# Patient Record
Sex: Female | Born: 1970 | Race: White | Hispanic: No | Marital: Married | Smoking: Current every day smoker
Health system: Southern US, Community
[De-identification: ages and names within clinical notes are randomized; demographics above are authoritative.]

## PROBLEM LIST (undated history)

## (undated) DIAGNOSIS — F419 Anxiety disorder, unspecified: Secondary | ICD-10-CM

---

## 2001-01-01 ENCOUNTER — Inpatient Hospital Stay (HOSPITAL_COMMUNITY): Admission: AD | Admit: 2001-01-01 | Discharge: 2001-01-04 | Payer: Self-pay | Admitting: Obstetrics and Gynecology

## 2001-01-05 ENCOUNTER — Encounter: Admission: RE | Admit: 2001-01-05 | Discharge: 2001-02-04 | Payer: Self-pay | Admitting: Obstetrics and Gynecology

## 2001-02-05 ENCOUNTER — Encounter: Admission: RE | Admit: 2001-02-05 | Discharge: 2001-03-07 | Payer: Self-pay | Admitting: Obstetrics and Gynecology

## 2002-01-23 ENCOUNTER — Other Ambulatory Visit: Admission: RE | Admit: 2002-01-23 | Discharge: 2002-01-23 | Payer: Self-pay | Admitting: Obstetrics and Gynecology

## 2002-09-25 ENCOUNTER — Inpatient Hospital Stay (HOSPITAL_COMMUNITY): Admission: AD | Admit: 2002-09-25 | Discharge: 2002-09-27 | Payer: Self-pay | Admitting: Obstetrics and Gynecology

## 2002-09-28 ENCOUNTER — Encounter: Admission: RE | Admit: 2002-09-28 | Discharge: 2002-10-28 | Payer: Self-pay | Admitting: Obstetrics & Gynecology

## 2003-02-11 ENCOUNTER — Other Ambulatory Visit: Admission: RE | Admit: 2003-02-11 | Discharge: 2003-02-11 | Payer: Self-pay | Admitting: Obstetrics & Gynecology

## 2004-08-04 ENCOUNTER — Other Ambulatory Visit: Admission: RE | Admit: 2004-08-04 | Discharge: 2004-08-04 | Payer: Self-pay | Admitting: Obstetrics & Gynecology

## 2009-05-05 ENCOUNTER — Ambulatory Visit (HOSPITAL_COMMUNITY): Admission: RE | Admit: 2009-05-05 | Discharge: 2009-05-05 | Payer: Self-pay | Admitting: Family Medicine

## 2010-10-20 NOTE — Discharge Summary (Signed)
NAMEDANIRA, Sharon Gillespie                         ACCOUNT NO.:  [id number]   MEDICAL RECORD NO.:  [id number]                   PATIENT TYPE:  INP   LOCATION:  9103                                 FACILITY:  [facility]   PHYSICIAN:  Gerrit Friends. Aldona Bar, M.D.                DATE OF BIRTH:  12-09-1970   DATE OF ADMISSION:  09/25/2002  DATE OF DISCHARGE:  09/27/2002                                 DISCHARGE SUMMARY   DISCHARGE DIAGNOSES:  1. Term pregnancy, delivered 7-pound-7-ounce female infant, Apgars 4, 7, and     8.  2. Blood type A positive.   PROCEDURE:  Normal spontaneous delivery.   SUMMARY:  This 40 year old gravida 2, para 1 was seen in the office on the  morning of 09/25/2002 and noted to be in early labor; she was term.  She had  good progression of her labor pattern.  She requested an epidural when she  was about 6 cm dilated, but unfortunately, there was going to be a delay in  placement of the epidural.  Stadol 0.5 mg was given intravenously.  Shortly  thereafter, the epidural was placed, and shortly after that, the patient  began pushing and, within one hour of receiving the Stadol, delivered a  normal female infant spontaneously.  Baby weighed 7 pounds 7 ounces.  She  delivered over an intact perineum.  There was a nuchal cord times one; it  required cutting to reduce prior to delivery of the body.  The Apgars were  noted to be 4, 7, and 8.  The baby was very floppy but responded well to  Narcan.  The pediatric intensive care team was called and responded, and, as  mentioned, the baby responded well to one dose of Narcan.  The presentation  at delivery was probably, in retrospect, related to the recent Stadol.   The mother's postpartum course was totally benign.  On the morning of  09/26/2002, her hemoglobin was 10.4 with a white count of 11,600 and a  platelet count of 129,000.  She was both breast feeding and bottle feeding.  On the morning of 09/27/2002, she was doing well; she  was ambulating,  tolerating a regular diet well, having normal bowel and bladder function,  was comfortable.  Vital signs were stable, breast feeding was going fairly  well, and she was ready for discharge, reporting that she was given all of  her instructions, asked questions and answered all questions as well.   DISCHARGE MEDICATIONS:  1. Vitamins one per day.  2. Ferrous sulfate 300 mg daily.  3. Motrin 600 mg every six hours as needed for pain.   FOLLOW UP:  She will return to the office for followup in approximately four  weeks' time or as needed.   CONDITION ON DISCHARGE:  Improved.  Gerrit Friends. Aldona Bar, M.D.    RMW/MEDQ  D:  09/27/2002  T:  09/27/2002  Job:  [id number]

## 2011-09-26 ENCOUNTER — Other Ambulatory Visit: Payer: Self-pay | Admitting: Obstetrics & Gynecology

## 2011-09-26 DIAGNOSIS — R928 Other abnormal and inconclusive findings on diagnostic imaging of breast: Secondary | ICD-10-CM

## 2011-09-28 ENCOUNTER — Ambulatory Visit
Admission: RE | Admit: 2011-09-28 | Discharge: 2011-09-28 | Disposition: A | Discharge status: Home / Self Care | Payer: Commercial Indemnity | Source: Ambulatory Visit | Attending: Obstetrics & Gynecology | Admitting: Obstetrics & Gynecology

## 2011-09-28 DIAGNOSIS — R928 Other abnormal and inconclusive findings on diagnostic imaging of breast: Secondary | ICD-10-CM

## 2016-04-16 ENCOUNTER — Emergency Department
Admission: EM | Admit: 2016-04-16 | Discharge: 2016-04-16 | Discharge status: Absent without leave | Payer: Commercial Indemnity

## 2020-03-23 ENCOUNTER — Ambulatory Visit: Payer: Commercial Indemnity

## 2020-05-16 ENCOUNTER — Emergency Department (HOSPITAL_COMMUNITY)
Admission: EM | Admit: 2020-05-16 | Discharge: 2020-05-16 | Disposition: A | Discharge status: Home / Self Care | Payer: BC Managed Care – PPO | Attending: Emergency Medicine | Admitting: Emergency Medicine

## 2020-05-16 ENCOUNTER — Encounter (HOSPITAL_COMMUNITY): Payer: Self-pay | Admitting: Emergency Medicine

## 2020-05-16 ENCOUNTER — Ambulatory Visit (INDEPENDENT_AMBULATORY_CARE_PROVIDER_SITE_OTHER): Payer: BC Managed Care – PPO

## 2020-05-16 ENCOUNTER — Other Ambulatory Visit: Payer: Self-pay

## 2020-05-16 ENCOUNTER — Emergency Department (HOSPITAL_COMMUNITY): Payer: BC Managed Care – PPO

## 2020-05-16 ENCOUNTER — Ambulatory Visit
Admission: RE | Admit: 2020-05-16 | Discharge: 2020-05-16 | Disposition: A | Discharge status: Home / Self Care | Payer: BC Managed Care – PPO | Source: Ambulatory Visit

## 2020-05-16 VITALS — BP 111/64 | HR 80 | Temp 99.1°F | Resp 16

## 2020-05-16 DIAGNOSIS — R109 Unspecified abdominal pain: Secondary | ICD-10-CM | POA: Insufficient documentation

## 2020-05-16 DIAGNOSIS — R82998 Other abnormal findings in urine: Secondary | ICD-10-CM | POA: Insufficient documentation

## 2020-05-16 DIAGNOSIS — F17200 Nicotine dependence, unspecified, uncomplicated: Secondary | ICD-10-CM | POA: Insufficient documentation

## 2020-05-16 DIAGNOSIS — R319 Hematuria, unspecified: Secondary | ICD-10-CM | POA: Diagnosis not present

## 2020-05-16 DIAGNOSIS — R1032 Left lower quadrant pain: Secondary | ICD-10-CM | POA: Insufficient documentation

## 2020-05-16 LAB — POCT URINALYSIS DIP (MANUAL ENTRY)
Glucose, UA: NEGATIVE mg/dL
Leukocytes, UA: NEGATIVE
Nitrite, UA: NEGATIVE
Protein Ur, POC: 30 mg/dL — AB
Spec Grav, UA: 1.025 (ref 1.010–1.025)
Urobilinogen, UA: 0.2 E.U./dL
pH, UA: 6 (ref 5.0–8.0)

## 2020-05-16 LAB — URINALYSIS, ROUTINE W REFLEX MICROSCOPIC
Bilirubin Urine: NEGATIVE
Glucose, UA: NEGATIVE mg/dL
Ketones, ur: NEGATIVE mg/dL
Leukocytes,Ua: NEGATIVE
Nitrite: NEGATIVE
Protein, ur: NEGATIVE mg/dL
Specific Gravity, Urine: 1.027 (ref 1.005–1.030)
pH: 5 (ref 5.0–8.0)

## 2020-05-16 LAB — POC URINE PREG, ED: Preg Test, Ur: NEGATIVE

## 2020-05-16 MED ORDER — KETOROLAC TROMETHAMINE 30 MG/ML IJ SOLN: 30.0000 mg | Freq: Once | INTRAMUSCULAR | Status: DC

## 2020-05-16 MED ORDER — CEPHALEXIN 500 MG PO CAPS: 500.0000 mg | ORAL_CAPSULE | Freq: Two times a day (BID) | ORAL | 0 refills | Status: AC

## 2020-05-16 NOTE — Discharge Instructions (Addendum)
Abdomen x-ray was negative Urine analysis is negative for UTI.  Sample will be sent for culture Go to ER for further evaluation

## 2020-05-16 NOTE — ED Triage Notes (Signed)
Pt c/o left sided flank pain, pt was seen at urgent care and sent over here for a ct scan to check for kidney stone.

## 2020-05-16 NOTE — Discharge Instructions (Signed)
You were given a prescription for antibiotics for a possible urinary tract infection. Please take the antibiotic prescription fully.   Please follow up with your primary care provider within 5-7 days for re-evaluation of your symptoms. If you do not have a primary care provider, information for a healthcare clinic has been provided for you to make arrangements for follow up care. Please return to the emergency department for any new or worsening symptoms.

## 2020-05-16 NOTE — ED Triage Notes (Signed)
Pt presents with left rib pain , denies injury, feels a catching sensation

## 2020-05-16 NOTE — ED Provider Notes (Signed)
[facility]   Chief Complaint  Patient presents with  . Flank Pain     SUBJECTIVE:  Sharon Gillespie is a 49 y.o. female presented to the urgent care with a complaint of left flank pain for the past few days.  Patient denies a precipitating event, recent sexual encounter, excessive caffeine intake.  Localizes the pain to the LUQ/ flank.  Pain is intermittent and describes it as aching character.  Has tried OTC medications without relief.  Symptoms are made worse with urination.  Admits to similar symptoms in the past.  Denies fever, chills, nausea, vomiting, abdominal pain, flank pain, abnormal vaginal discharge or bleeding, hematuria.    LMP: No LMP recorded. (Menstrual status: Perimenopausal).  ROS: As in HPI.  All other pertinent ROS negative.     History reviewed. No pertinent past medical history. History reviewed. No pertinent surgical history. No Known Allergies No current facility-administered medications on file prior to encounter.   No current outpatient medications on file prior to encounter.   Social History   Socioeconomic History  . Marital status: Married    Spouse name: Not on file  . Number of children: Not on file  . Years of education: Not on file  . Highest education level: Not on file  Occupational History  . Not on file  Tobacco Use  . Smoking status: Never Smoker  . Smokeless tobacco: Never Used  Substance and Sexual Activity  . Alcohol use: Not Currently  . Drug use: Never  . Sexual activity: Not on file  Other Topics Concern  . Not on file  Social History Narrative   ** Merged History Encounter **       Social Determinants of Health   Financial Resource Strain: Not on file  Food Insecurity: Not on file  Transportation Needs: Not on file  Physical Activity: Not on file  Stress: Not on file  Social Connections: Not on file  Intimate Partner Violence: Not on file   Family History  Family history unknown: Yes     OBJECTIVE:  Vitals:   05/16/20 1226  BP: 111/64  Pulse: 80  Resp: 16  Temp: 99.1 F (37.3 C)  SpO2: 98%   Physical Exam Vitals and nursing note reviewed.  Constitutional:      General: She is not in acute distress.    Appearance: Normal appearance. She is normal weight. She is not ill-appearing, toxic-appearing or diaphoretic.  HENT:     Head: Normocephalic.  Cardiovascular:     Rate and Rhythm: Normal rate and regular rhythm.     Pulses: Normal pulses.     Heart sounds: Normal heart sounds. No murmur heard. No friction rub. No gallop.   Pulmonary:     Effort: Pulmonary effort is normal. No respiratory distress.     Breath sounds: Normal breath sounds. No stridor. No wheezing, rhonchi or rales.  Chest:     Chest wall: No tenderness.  Abdominal:     General: Abdomen is flat. Bowel sounds are normal. There is no distension.     Palpations: Abdomen is soft. There is no mass.     Tenderness: There is no abdominal tenderness. There is left CVA tenderness.     Hernia: No hernia is present.  Musculoskeletal:     Thoracic back: Normal.     Lumbar back: Normal.  Neurological:     Mental Status: She is alert and oriented to person, place, and time.     Labs Reviewed  POCT  URINALYSIS DIP (MANUAL ENTRY) - Abnormal; Notable for the following components:      Result Value   Color, UA straw (*)    Bilirubin, UA small (*)    Ketones, POC UA small (15) (*)    Blood, UA moderate (*)    Protein Ur, POC =30 (*)    All other components within normal limits  URINE CULTURE    ASSESSMENT & PLAN:  1. Left flank pain     No orders of the defined types were placed in this encounter.  Patient is stable at discharge.  She was advised to go to ER for further evaluation to rule out other abdominal disease process.  In the urgent care KUB and urinalysis were negative   Discharge instructions  Abdomen x-ray was negative Urine analysis is negative for UTI.  Sample will be sent  for culture Go to ER for further evaluation  Outlined signs and symptoms indicating need for more acute intervention. Patient verbalized understanding. After Visit Summary given.     Durward Parcel, FNP 05/16/20 [phone]

## 2020-05-16 NOTE — ED Provider Notes (Signed)
[facility] EMERGENCY DEPARTMENT Provider Note   CSN: [id number] Arrival date & time: 05/16/20  1522     History Chief Complaint  Patient presents with  . Flank Pain    Sharon Gillespie is a 49 y.o. female.  HPI   Pt is a 49 y/o female who presents to the ED today for eval of left flank pain. Pain started 2 days ago. States pain has improved since onset. Pain is constant in nature. It is worse with inspiration, coughing, or certain movements. Denies dysuria, frequency or urgency. Denies fevers, diarrhea, vomiting. Last BM today.   Tried gasx without relief  No past medical history on file.  There are no problems to display for this patient.   No past surgical history on file.   OB History   No obstetric history on file.     Family History  Family history unknown: Yes    Social History   Tobacco Use  . Smoking status: Current Every Day Smoker    Packs/day: 0.50  . Smokeless tobacco: Never Used  Substance Use Topics  . Alcohol use: Not Currently  . Drug use: Never    Home Medications Prior to Admission medications   Medication Sig Start Date End Date Taking? Authorizing Provider  norethindrone-ethinyl estradiol 1/35 (ALAYCEN 1/35) tablet Take 1 tablet by mouth daily.   Yes [provider]  sertraline (ZOLOFT) 100 MG tablet Take 100 mg by mouth daily.   Yes [provider]  cephALEXin (KEFLEX) 500 MG capsule Take 1 capsule (500 mg total) by mouth 2 (two) times daily for 7 days. 05/16/20 05/23/20  Janny Crute S, PA-C  ciprofloxacin (CIPRO) 500 MG tablet ciprofloxacin 500 mg tablet  TAKE 1 TABLET TWICE A DAY    [provider]    Allergies    Patient has no known allergies.  Review of Systems   Review of Systems  Constitutional: Negative for fever.  HENT: Negative for ear pain and sore throat.   Eyes: Negative for visual disturbance.  Respiratory: Negative for cough and shortness of breath.   Cardiovascular: Negative for  chest pain.  Gastrointestinal: Positive for abdominal pain. Negative for constipation, diarrhea, nausea and vomiting.  Genitourinary: Positive for flank pain. Negative for dysuria and hematuria.  Musculoskeletal: Negative for back pain.  Skin: Negative for rash.  Neurological: Negative for seizures and syncope.  All other systems reviewed and are negative.   Physical Exam Updated Vital Signs BP (!) 141/89   Pulse 76   Temp 98.5 F (36.9 C) (Oral)   Resp 17   Ht 5\' 6"  (1.676 m)   Wt 81.6 kg   SpO2 100%   BMI 29.05 kg/m   Physical Exam Vitals and nursing note reviewed.  Constitutional:      General: She is not in acute distress.    Appearance: She is well-developed and well-nourished.  HENT:     Head: Normocephalic and atraumatic.  Eyes:     Conjunctiva/sclera: Conjunctivae normal.  Cardiovascular:     Rate and Rhythm: Normal rate and regular rhythm.     Heart sounds: No murmur heard.   Pulmonary:     Effort: Pulmonary effort is normal. No respiratory distress.     Breath sounds: Normal breath sounds.  Abdominal:     General: Bowel sounds are normal.     Palpations: Abdomen is soft.     Tenderness: There is no abdominal tenderness. There is no right CVA tenderness, left CVA tenderness, guarding or rebound.  Musculoskeletal:        General: No edema.     Cervical back: Neck supple.  Skin:    General: Skin is warm and dry.  Neurological:     Mental Status: She is alert.  Psychiatric:        Mood and Affect: Mood and affect normal.     ED Results / Procedures / Treatments   Labs (all labs ordered are listed, but only abnormal results are displayed) Labs Reviewed  URINALYSIS, ROUTINE W REFLEX MICROSCOPIC - Abnormal; Notable for the following components:      Result Value   APPearance HAZY (*)    Hgb urine dipstick MODERATE (*)    Bacteria, UA FEW (*)    All other components within normal limits  POC URINE PREG, ED    EKG None  Radiology DG Abdomen 1  View  Result Date: 05/16/2020 CLINICAL DATA:  Left flank pain with hematuria EXAM: ABDOMEN - 1 VIEW COMPARISON:  None. FINDINGS: The bowel gas pattern is normal. No radio-opaque calculi or other significant radiographic abnormality are seen. IMPRESSION: Negative. Electronically Signed   By: Marlan Palau M.D.   On: 05/16/2020 14:43   CT Renal Stone Study  Result Date: 05/16/2020 CLINICAL DATA:  Left flank pain. EXAM: CT ABDOMEN AND PELVIS WITHOUT CONTRAST TECHNIQUE: Multidetector CT imaging of the abdomen and pelvis was performed following the standard protocol without IV contrast. COMPARISON:  Radiographs earlier today. FINDINGS: Lower chest: Lung bases are clear. No focal airspace disease or pleural fluid. Hepatobiliary: No focal hepatic abnormality on noncontrast exam. Gallbladder physiologically distended, no calcified stone. No biliary dilatation. Pancreas: No ductal dilatation or inflammation. Spleen: Normal in size without focal abnormality. Adrenals/Urinary Tract: Normal adrenal glands. No hydronephrosis, perinephric edema, or renal calculi. Parapelvic cyst adjacent to the mid lower left kidney. No ureteral stones. The urinary bladder is partially distended. No bladder stone. Stomach/Bowel: Stomach decompressed. Normal positioning of the duodenum and ligament of Treitz. Unremarkable small bowel without obstruction or inflammation. Normal appendix. Small to moderate volume of stool throughout the colon. No colonic wall thickening or pericolonic edema. Vascular/Lymphatic: Abdominal aorta is normal in caliber. There is no bulky abdominopelvic adenopathy. Reproductive: Uterus and bilateral adnexa are unremarkable. Other: No ascites, free air, or focal fluid collection. Tiny fat containing umbilical hernia. Musculoskeletal: There are no acute or suspicious osseous abnormalities. No musculoskeletal findings to explain flank pain. IMPRESSION: 1. No renal stones or obstructive uropathy. No acute abnormality  in the abdomen/pelvis. 2. Tiny fat containing umbilical hernia. Electronically Signed   By: Narda Rutherford M.D.   On: 05/16/2020 20:37    Procedures Procedures (including critical care time)  Medications Ordered in ED Medications - No data to display  ED Course  I have reviewed the triage vital signs and the nursing notes.  Pertinent labs & imaging results that were available during my care of the patient were reviewed by me and considered in my medical decision making (see chart for details).    MDM Rules/Calculators/A&P                          49 year old female presenting for evaluation of 3-day history of left lower abdominal/left flank pain.  Has improved since onset.  Went to urgent care prior to arrival and had blood in her urine, there was some concern for a kidney stone so she was sent here for CT scan to rule that out.  On my evaluation she  does not have any significant tenderness on exam, she is nontoxic and nonseptic appearing.  Her urinalysis does show some blood and white blood cells with some few bacteria and coaxalate crystals.  Her her CT scan did not show any evidence of a ureteral stone and did not show any other emergent abnormalities at this time.  It is possible that she may have passed a small stone and has having some pain from that however we will also cover her for a UTI given that she does have some bacteria in the urine.  We initially attempted to check labs however we were notified by labs that the specimen was hemolyzed.  I offered to repeat the labs however patient declined and prefers to be discharged home to follow-up with her PCP.  I advised on plan for follow-up and strict return precautions.  She voiced understanding of the plan and reasons to return.  All questions answered.  Patient stable for discharge.  Final Clinical Impression(s) / ED Diagnoses Final diagnoses:  Flank pain    Rx / DC Orders ED Discharge Orders         Ordered    cephALEXin  (KEFLEX) 500 MG capsule  2 times daily        05/16/20 2308           Karrie Meres, PA-C 05/16/20 2310    Bethann Berkshire, MD 05/17/20 2215

## 2020-05-17 LAB — URINE CULTURE: Culture: <10000 — AB

## 2020-08-03 ENCOUNTER — Other Ambulatory Visit: Payer: Self-pay

## 2020-08-03 ENCOUNTER — Ambulatory Visit
Admission: RE | Admit: 2020-08-03 | Discharge: 2020-08-03 | Disposition: A | Discharge status: Home / Self Care | Payer: BC Managed Care – PPO | Source: Ambulatory Visit

## 2020-08-03 VITALS — BP 119/74 | HR 92 | Temp 98.1°F | Resp 18

## 2020-08-03 DIAGNOSIS — J0100 Acute maxillary sinusitis, unspecified: Secondary | ICD-10-CM | POA: Diagnosis not present

## 2020-08-03 MED ORDER — FLUTICASONE PROPIONATE 50 MCG/ACT NA SUSP: 1.0000 | Freq: Every day | NASAL | 0 refills | Status: DC

## 2020-08-03 MED ORDER — AMOXICILLIN-POT CLAVULANATE 875-125 MG PO TABS: 1.0000 | ORAL_TABLET | Freq: Two times a day (BID) | ORAL | 0 refills | Status: DC

## 2020-08-03 MED ORDER — PREDNISONE 10 MG PO TABS: 20.0000 mg | ORAL_TABLET | Freq: Every day | ORAL | 0 refills | Status: DC

## 2020-08-03 NOTE — Discharge Instructions (Addendum)
Rest and push fluids Continue Sudafed as prescribed and directed Augmentin prescribed.  Take as directed and to completion Prednisone was prescribed/take as directed Continue with OTC ibuprofen/tylenol as needed for pain Follow up with PCP or [facility] if symptoms persists Return or go to the ED if you have any new or worsening symptoms such as fever, chills, worsening sinus pain/pressure, cough, sore throat, chest pain, shortness of breath, abdominal pain, changes in bowel or bladder habits, etc..Marland Kitchen

## 2020-08-03 NOTE — ED Provider Notes (Addendum)
[facility] CARE CENTER   [id number] 08/03/20 Arrival Time: 1355   CC: Sinusitis  SUBJECTIVE: History from: patient.  Montanna Mcbain is a 50 y.o. female presented to the urgent care for complaint of nasal congestion with green nasal discharge, sinus pressure and sinus pain for the past few weeks.  Denies sick exposure to COVID, flu or strep.  Denies recent travel.  Has tried OTC medication without relief.  Denies alleviating or aggravating factors.  Denies previous symptoms in the past.   Denies fever, chills, fatigue, rhinorrhea, sore throat, SOB, wheezing, chest pain, nausea, changes in bowel or bladder habits.    ROS: As per HPI.  All other pertinent ROS negative.     History reviewed. No pertinent past medical history. History reviewed. No pertinent surgical history. No Known Allergies No current facility-administered medications on file prior to encounter.   Current Outpatient Medications on File Prior to Encounter  Medication Sig Dispense Refill  . pseudoephedrine (SUDAFED) 30 MG tablet Take 30 mg by mouth every 4 (four) hours as needed for congestion.    . ciprofloxacin (CIPRO) 500 MG tablet ciprofloxacin 500 mg tablet  TAKE 1 TABLET TWICE A DAY    . norethindrone-ethinyl estradiol 1/35 (ALAYCEN 1/35) tablet Take 1 tablet by mouth daily.    . sertraline (ZOLOFT) 100 MG tablet Take 100 mg by mouth daily.     Social History   Socioeconomic History  . Marital status: Married    Spouse name: Not on file  . Number of children: Not on file  . Years of education: Not on file  . Highest education level: Not on file  Occupational History  . Not on file  Tobacco Use  . Smoking status: Current Every Day Smoker    Packs/day: 0.50  . Smokeless tobacco: Never Used  Substance and Sexual Activity  . Alcohol use: Not Currently  . Drug use: Never  . Sexual activity: Not on file  Other Topics Concern  . Not on file  Social History Narrative   ** Merged History Encounter **        Social Determinants of Health   Financial Resource Strain: Not on file  Food Insecurity: Not on file  Transportation Needs: Not on file  Physical Activity: Not on file  Stress: Not on file  Social Connections: Not on file  Intimate Partner Violence: Not on file   Family History  Family history unknown: Yes    OBJECTIVE:  Vitals:   08/03/20 1402  BP: 119/74  Pulse: 92  Resp: 18  Temp: 98.1 F (36.7 C)  TempSrc: Oral  SpO2: 96%     Physical Exam Vitals and nursing note reviewed.  Constitutional:      General: She is not in acute distress.    Appearance: Normal appearance. She is normal weight. She is not ill-appearing, toxic-appearing or diaphoretic.  HENT:     Head: Normocephalic.     Right Ear: Tympanic membrane, ear canal and external ear normal. There is no impacted cerumen.     Left Ear: Tympanic membrane, ear canal and external ear normal. There is no impacted cerumen.     Nose: Congestion present.     Right Sinus: Maxillary sinus tenderness present.     Left Sinus: Maxillary sinus tenderness present.  Cardiovascular:     Rate and Rhythm: Normal rate and regular rhythm.     Pulses: Normal pulses.     Heart sounds: Normal heart sounds. No murmur heard. No friction rub. No gallop.  Pulmonary:     Effort: Pulmonary effort is normal. No respiratory distress.     Breath sounds: Normal breath sounds. No stridor. No wheezing, rhonchi or rales.  Chest:     Chest wall: No tenderness.  Neurological:     Mental Status: She is alert and oriented to person, place, and time.     LABS:  No results found for this or any previous visit (from the past 24 hour(s)).   ASSESSMENT & PLAN:  1. Acute non-recurrent maxillary sinusitis     Meds ordered this encounter  Medications  . amoxicillin-clavulanate (AUGMENTIN) 875-125 MG tablet    Sig: Take 1 tablet by mouth every 12 (twelve) hours.    Dispense:  14 tablet    Refill:  0  . fluticasone (FLONASE) 50 MCG/ACT  nasal spray    Sig: Place 1 spray into both nostrils daily for 14 days.    Dispense:  16 g    Refill:  0  . predniSONE (DELTASONE) 10 MG tablet    Sig: Take 2 tablets (20 mg total) by mouth daily.    Dispense:  15 tablet    Refill:  0    Discharge Instructions  Rest and push fluids Continue Sudafed as prescribed and directed Augmentin prescribed.  Take as directed and to completion Prednisone was prescribed/take as directed Continue with OTC ibuprofen/tylenol as needed for pain Follow up with PCP or [facility] if symptoms persists Return or go to the ED if you have any new or worsening symptoms such as fever, chills, worsening sinus pain/pressure, cough, sore throat, chest pain, shortness of breath, abdominal pain, changes in bowel or bladder habits, etc...  Reviewed expectations re: course of current medical issues. Questions answered. Outlined signs and symptoms indicating need for more acute intervention. Patient verbalized understanding. After Visit Summary given.         Durward Parcel, FNP 08/03/20 1432    Durward Parcel, FNP 08/03/20 1433

## 2020-08-03 NOTE — ED Triage Notes (Signed)
Congestion x several weeks.  Has been trying to treat symptoms with over the counter products.  Congestion has gotten worse.  Facial and teeth pain.  Green color nasal congestion.

## 2020-11-07 ENCOUNTER — Other Ambulatory Visit: Payer: Self-pay

## 2020-11-07 ENCOUNTER — Ambulatory Visit
Admission: EM | Admit: 2020-11-07 | Discharge: 2020-11-07 | Disposition: A | Discharge status: Home / Self Care | Payer: BC Managed Care – PPO | Attending: Family Medicine | Admitting: Family Medicine

## 2020-11-07 DIAGNOSIS — R319 Hematuria, unspecified: Secondary | ICD-10-CM

## 2020-11-07 LAB — POCT URINALYSIS DIP (MANUAL ENTRY)
Bilirubin, UA: NEGATIVE
Glucose, UA: NEGATIVE mg/dL
Ketones, POC UA: NEGATIVE mg/dL
Leukocytes, UA: NEGATIVE
Nitrite, UA: NEGATIVE
Protein Ur, POC: NEGATIVE mg/dL
Spec Grav, UA: 1.025 (ref 1.010–1.025)
Urobilinogen, UA: 0.2 E.U./dL — AB
pH, UA: 6 (ref 5.0–8.0)

## 2020-11-07 NOTE — Discharge Instructions (Signed)
I have given you a note to have faxed today  We will culture your urine to be sure that there is no infection  Follow up with this office or with primary care if symptoms are persisting.  Follow up in the ER for high fever, trouble swallowing, trouble breathing, other concerning symptoms.

## 2020-11-07 NOTE — ED Triage Notes (Signed)
Pt had physical for new job and was told she had blood in urine, was told she needed further evaluation

## 2020-11-10 ENCOUNTER — Telehealth (HOSPITAL_COMMUNITY): Payer: Self-pay | Admitting: Emergency Medicine

## 2020-11-10 LAB — URINE CULTURE: Culture: 40000 — AB

## 2020-11-10 MED ORDER — NITROFURANTOIN MONOHYD MACRO 100 MG PO CAPS: 100.0000 mg | ORAL_CAPSULE | Freq: Two times a day (BID) | ORAL | 0 refills | Status: DC

## 2020-11-13 NOTE — ED Provider Notes (Signed)
[facility]   CC: UTI  SUBJECTIVE:  Sharon Gillespie is a 50 y.o. female who complains of hematuria with a physical for a new job last week. Was told that she should seek follow up. Patient denies a precipitating event, recent sexual encounter, excessive caffeine intake. Denies dysuria, hematuria, frequency, urgency. There are not aggravating or alleviating factors. Admits to similar symptoms in the past. Denies fever, chills, nausea, vomiting, abdominal pain, flank pain, abnormal vaginal discharge or bleeding, hematuria.    LMP: Patient's last menstrual period was 10/02/2020 (approximate).  ROS: As in HPI.  All other pertinent ROS negative.     No past medical history on file. No past surgical history on file. No Known Allergies No current facility-administered medications on file prior to encounter.   Current Outpatient Medications on File Prior to Encounter  Medication Sig Dispense Refill   amoxicillin-clavulanate (AUGMENTIN) 875-125 MG tablet Take 1 tablet by mouth every 12 (twelve) hours. 14 tablet 0   ciprofloxacin (CIPRO) 500 MG tablet ciprofloxacin 500 mg tablet  TAKE 1 TABLET TWICE A DAY     fluticasone (FLONASE) 50 MCG/ACT nasal spray Place 1 spray into both nostrils daily for 14 days. 16 g 0   norethindrone-ethinyl estradiol 1/35 (ALAYCEN 1/35) tablet Take 1 tablet by mouth daily.     predniSONE (DELTASONE) 10 MG tablet Take 2 tablets (20 mg total) by mouth daily. 15 tablet 0   pseudoephedrine (SUDAFED) 30 MG tablet Take 30 mg by mouth every 4 (four) hours as needed for congestion.     sertraline (ZOLOFT) 100 MG tablet Take 100 mg by mouth daily.     Social History   Socioeconomic History   Marital status: Married    Spouse name: Not on file   Number of children: Not on file   Years of education: Not on file   Highest education level: Not on file  Occupational History   Not on file  Tobacco Use   Smoking status: Every Day    Packs/day: 0.50    Pack  years: 0.00    Types: Cigarettes   Smokeless tobacco: Never  Substance and Sexual Activity   Alcohol use: Not Currently   Drug use: Never   Sexual activity: Not on file  Other Topics Concern   Not on file  Social History Narrative   ** Merged History Encounter **       Social Determinants of Health   Financial Resource Strain: Not on file  Food Insecurity: Not on file  Transportation Needs: Not on file  Physical Activity: Not on file  Stress: Not on file  Social Connections: Not on file  Intimate Partner Violence: Not on file   Family History  Family history unknown: Yes    OBJECTIVE:  Vitals:   11/07/20 1634  BP: 109/74  Pulse: 78  Resp: 16  Temp: 98.3 F (36.8 C)  TempSrc: Oral  SpO2: 97%   General appearance: AOx3 in no acute distress HEENT: NCAT. Oropharynx clear.  Lungs: clear to auscultation bilaterally without adventitious breath sounds Heart: regular rate and rhythm. Radial pulses 2+ symmetrical bilaterally Abdomen: soft; non-distended; no tenderness; bowel sounds present; no guarding or rebound tenderness Back: no CVA tenderness Extremities: no edema; symmetrical with no gross deformities Skin: warm and dry Neurologic: Ambulates from chair to exam table without difficulty Psychological: alert and cooperative; normal mood and affect  Labs Reviewed  URINE CULTURE - Abnormal; Notable for the following components:      Result Value  Culture   (*)    Value: 40,000 COLONIES/mL ENTEROCOCCUS FAECALIS 10,000 COLONIES/mL ESCHERICHIA COLI    Organism ID, Bacteria ENTEROCOCCUS FAECALIS (*)    Organism ID, Bacteria ESCHERICHIA COLI (*)    All other components within normal limits  POCT URINALYSIS DIP (MANUAL ENTRY) - Abnormal; Notable for the following components:   Color, UA light yellow (*)    Blood, UA large (*)    Urobilinogen, UA 0.2 (*)    All other components within normal limits    ASSESSMENT & PLAN:  1. Hematuria, unspecified type    UA  unremarkable for infection today Urine culture sent due to large amt blood We will call you with abnormal results that need further treatment Push fluids and get plenty of rest Take antibiotic as directed and to completion Take pyridium as prescribed and as needed for symptomatic relief Follow up with PCP if symptoms persists Return here or go to ER if you have any new or worsening symptoms such as fever, worsening abdominal pain, nausea/vomiting, flank pain  Outlined signs and symptoms indicating need for more acute intervention Patient verbalized understanding After Visit Summary given      Moshe Cipro, NP 11/13/20 1517

## 2021-05-19 ENCOUNTER — Encounter (HOSPITAL_COMMUNITY): Payer: Self-pay | Admitting: Emergency Medicine

## 2021-05-19 ENCOUNTER — Emergency Department (HOSPITAL_COMMUNITY): Payer: Self-pay

## 2021-05-19 ENCOUNTER — Emergency Department (HOSPITAL_COMMUNITY)
Admission: EM | Admit: 2021-05-19 | Discharge: 2021-05-19 | Disposition: A | Discharge status: Home / Self Care | Payer: Self-pay | Attending: Student | Admitting: Student

## 2021-05-19 ENCOUNTER — Other Ambulatory Visit: Payer: Self-pay

## 2021-05-19 DIAGNOSIS — H5789 Other specified disorders of eye and adnexa: Secondary | ICD-10-CM | POA: Insufficient documentation

## 2021-05-19 DIAGNOSIS — H02401 Unspecified ptosis of right eyelid: Secondary | ICD-10-CM

## 2021-05-19 DIAGNOSIS — M542 Cervicalgia: Secondary | ICD-10-CM | POA: Insufficient documentation

## 2021-05-19 DIAGNOSIS — Z20822 Contact with and (suspected) exposure to covid-19: Secondary | ICD-10-CM | POA: Insufficient documentation

## 2021-05-19 DIAGNOSIS — F17210 Nicotine dependence, cigarettes, uncomplicated: Secondary | ICD-10-CM | POA: Insufficient documentation

## 2021-05-19 LAB — RESP PANEL BY RT-PCR (FLU A&B, COVID) ARPGX2
Influenza A by PCR: NEGATIVE
Influenza B by PCR: NEGATIVE
SARS Coronavirus 2 by RT PCR: NEGATIVE

## 2021-05-19 LAB — CBC WITH DIFFERENTIAL/PLATELET
Abs Immature Granulocytes: 0.01 10*3/uL (ref 0.00–0.07)
Basophils Absolute: 0 10*3/uL (ref 0.0–0.1)
Basophils Relative: 1 %
Eosinophils Absolute: 0.1 10*3/uL (ref 0.0–0.5)
Eosinophils Relative: 2 %
HCT: 38.9 % (ref 36.0–46.0)
Hemoglobin: 12.4 g/dL (ref 12.0–15.0)
Immature Granulocytes: 0 %
Lymphocytes Relative: 43 %
Lymphs Abs: 2.8 10*3/uL (ref 0.7–4.0)
MCH: 28.8 pg (ref 26.0–34.0)
MCHC: 31.9 g/dL (ref 30.0–36.0)
MCV: 90.5 fL (ref 80.0–100.0)
Monocytes Absolute: 0.3 10*3/uL (ref 0.1–1.0)
Monocytes Relative: 5 %
Neutro Abs: 3.2 10*3/uL (ref 1.7–7.7)
Neutrophils Relative %: 49 %
Platelets: 305 10*3/uL (ref 150–400)
RBC: 4.3 MIL/uL (ref 3.87–5.11)
RDW: 13.1 % (ref 11.5–15.5)
WBC: 6.5 10*3/uL (ref 4.0–10.5)
nRBC: 0 % (ref 0.0–0.2)

## 2021-05-19 LAB — BASIC METABOLIC PANEL
Anion gap: 7 (ref 5–15)
BUN: 8 mg/dL (ref 6–20)
CO2: 20 mmol/L — ABNORMAL LOW (ref 22–32)
Calcium: 9 mg/dL (ref 8.9–10.3)
Chloride: 106 mmol/L (ref 98–111)
Creatinine, Ser: 0.93 mg/dL (ref 0.44–1.00)
GFR, Estimated: >60 mL/min (ref >60)
Glucose, Bld: 92 mg/dL (ref 70–99)
Potassium: 3.7 mmol/L (ref 3.5–5.1)
Sodium: 133 mmol/L — ABNORMAL LOW (ref 135–145)

## 2021-05-19 LAB — I-STAT BETA HCG BLOOD, ED (MC, WL, AP ONLY): I-stat hCG, quantitative: <5 m[IU]/mL (ref <5)

## 2021-05-19 MED ORDER — GADOBUTROL 1 MMOL/ML IV SOLN
8.0000 mL | Freq: Once | INTRAVENOUS | Status: AC | PRN
  Administered 2021-05-19: 8 mL via INTRAVENOUS

## 2021-05-19 MED ORDER — IOHEXOL 350 MG/ML SOLN
65.0000 mL | Freq: Once | INTRAVENOUS | Status: AC | PRN
  Administered 2021-05-19: 65 mL via INTRAVENOUS

## 2021-05-19 MED ORDER — ASPIRIN 81 MG PO TBEC: 81.0000 mg | DELAYED_RELEASE_TABLET | Freq: Every day | ORAL | 12 refills | Status: DC

## 2021-05-19 NOTE — Discharge Instructions (Addendum)
It was a pleasure taking care of you today.  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 I discussed with neurology who recommend taking Aspirin 81mg  daily and to follow-up with neurology in the outpatient setting. I have placed a referral to neurology. They should call you within the next week to schedule an appointment. Return to the ER for new or worsening symptoms.

## 2021-05-19 NOTE — ED Notes (Signed)
RN reviewed discharge information with pt. Pt verbalized understanding and had no further questions. VSS upon discharge.

## 2021-05-19 NOTE — ED Provider Notes (Signed)
[facility] EM[facility]ENC[facility] DEPARTMENT Provider Note   CSN: [id number] Arrival date & time: 05/19/21  1123     History Chief Complaint  Patient presents with   Eye Problem    Sharon Gillespie is a 50 y.o. female who presents to the ED today with complaint of eye problem.  Patient reports about 1.5 to 2 months ago she was cleaning her house.  She states that she was cleaning vaulted ceilings with her right arm outstretched.  She then proceeded to clean her windows with the same motion.  The next day she began having right-sided neck pain and states she had difficulty moving her neck.  Shortly afterwards she began experiencing blurry vision in the right eye and noticed that her right eyelid was slightly drooped.  She went to see an optometrist who related to her cornea and evaluated the eye itself.  She was prescribed glasses and told that her symptoms were likely unrelated to cleaning/outstretch movement of her arm.  She was however referred to ophthalmology for further evaluation of ptosis of the right lid.  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  He advised that she come to the ED for CTA head and neck to rule out dissection as well as imaging of the chest to evaluate for Pancoast tumor.  She does report that the pain in her neck subsided after about 2 weeks however has continued to have blurry vision in her right eye and drooped eyelid.   Note from Ophthalmology:    The history is provided by the patient and medical records.      History reviewed. No pertinent past medical history.  There are no problems to display for this patient.   No past surgical history on file.   OB History   No obstetric history on file.     Family History  Family history unknown: Yes    Social History   Tobacco Use   Smoking status: Every Day    Packs/day: 0.50    Types: Cigarettes   Smokeless  tobacco: Never  Substance Use Topics   Alcohol use: Not Currently   Drug use: Never    Home Medications Prior to Admission medications   Medication Sig Start Date End Date Taking? Authorizing Provider  amoxicillin-clavulanate (AUGMENTIN) 875-125 MG tablet Take 1 tablet by mouth every 12 (twelve) hours. 08/03/20   Avegno, Darrelyn Hillock, FNP  ciprofloxacin (CIPRO) 500 MG tablet ciprofloxacin 500 mg tablet  TAKE 1 TABLET TWICE A DAY    [provider]  fluticasone (FLONASE) 50 MCG/ACT nasal spray Place 1 spray into both nostrils daily for 14 days. 08/03/20 08/17/20  Avegno, Darrelyn Hillock, FNP  nitrofurantoin, macrocrystal-monohydrate, (MACROBID) 100 MG capsule Take 1 capsule (100 mg total) by mouth 2 (two) times daily. 11/10/20   Chase Picket, MD  norethindrone-ethinyl estradiol 1/35 (ALAYCEN 1/35) tablet Take 1 tablet by mouth daily.    [provider]  phentermine (ADIPEX-P) 37.5 MG tablet Take 37.5 mg by mouth every morning. 05/04/21   [provider]  predniSONE (DELTASONE) 10 MG tablet Take 2 tablets (20 mg total) by mouth daily. 08/03/20   Avegno, Darrelyn Hillock, FNP  pseudoephedrine (SUDAFED) 30 MG tablet Take 30 mg by mouth every 4 (four) hours as needed for congestion.    [provider]  sertraline (ZOLOFT) 100 MG tablet Take 100 mg by mouth daily.    [provider]  Allergies    Patient has no known allergies.  Review of Systems   Review of Systems  Constitutional:  Negative for chills and fever.  Eyes:  Positive for visual disturbance (blurry vision).       + eyelid issue  Respiratory:  Negative for cough and shortness of breath.   Cardiovascular:  Negative for chest pain.  Musculoskeletal:  Positive for neck pain (resolved).  Neurological:  Positive for headaches.  All other systems reviewed and are negative.  Physical Exam Updated Vital Signs BP (!) 143/78 (BP Location: Left Arm)    Pulse 90    Temp 98.8 F (37.1 C) (Oral)    Resp 16     SpO2 100%   Physical Exam Vitals and nursing note reviewed.  Constitutional:      Appearance: She is not ill-appearing or diaphoretic.  HENT:     Head: Normocephalic and atraumatic.  Eyes:     Extraocular Movements: Extraocular movements intact.     Conjunctiva/sclera: Conjunctivae normal.     Pupils: Pupils are equal, round, and reactive to light.     Comments: Slight ptosis of R eyelid  Cardiovascular:     Rate and Rhythm: Normal rate and regular rhythm.  Pulmonary:     Effort: Pulmonary effort is normal.     Breath sounds: Normal breath sounds. No wheezing, rhonchi or rales.  Abdominal:     Palpations: Abdomen is soft.     Tenderness: There is no abdominal tenderness.  Musculoskeletal:     Cervical back: Neck supple.  Skin:    General: Skin is warm and dry.  Neurological:     Mental Status: She is alert.     Comments: Alert and oriented to self, place, time and event.   Speech is fluent, clear without dysarthria or dysphasia.   Strength 5/5 in upper/lower extremities   Sensation intact in upper/lower extremities   Normal gait.  Negative Romberg. No pronator drift.  Normal finger-to-nose and feet tapping.  CN I not tested  CN II grossly intact visual fields bilaterally. Did not visualize posterior eye.  CN III, IV, VI PERRLA and EOMs intact bilaterally  CN V Intact sensation to sharp and light touch to the face  CN VII facial movements symmetric  CN VIII not tested  CN IX, X no uvula deviation, symmetric rise of soft palate  CN XI 5/5 SCM and trapezius strength bilaterally  CN XII Midline tongue protrusion, symmetric L/R movements      ED Results / Procedures / Treatments   Labs (all labs ordered are listed, but only abnormal results are displayed) Labs Reviewed  BASIC METABOLIC PANEL - Abnormal; Notable for the following components:      Result Value   Sodium 133 (*)    CO2 20 (*)    All other components within normal limits  RESP PANEL BY RT-PCR (FLU  A&B, COVID) ARPGX2  CBC WITH DIFFERENTIAL/PLATELET  I-STAT BETA HCG BLOOD, ED ([facility], [facility], [facility] ONLY)    EKG None  Radiology CT ANGIO HEAD NECK W WO CM  Result Date: 05/19/2021 CLINICAL DATA:  Right neck pain starting 1-1/2 months ago, blurred vision in right eye EXAM: CT ANGIOGRAPHY HEAD AND NECK TECHNIQUE: Multidetector CT imaging of the head and neck was performed using the standard protocol during bolus administration of intravenous contrast. Multiplanar CT image reconstructions and MIPs were obtained to evaluate the vascular anatomy. Carotid stenosis measurements (when applicable) are obtained utilizing NASCET criteria, using the distal internal carotid  diameter as the denominator. CONTRAST:  21mL OMNIPAQUE IOHEXOL 350 MG/ML SOLN COMPARISON:  None. FINDINGS: CTA NECK FINDINGS Aortic arch: The aortic arch is normal in appearance. The origins of the major branch vessels are patent. Right carotid system: The right common, internal, and external carotid arteries are patent. There is mild beading of the distal right internal carotid artery best appreciated in the sagittal plane (13-17). There is no evidence of dissection or aneurysm. Left carotid system: The left common and external carotid arteries are patent. The left internal carotid artery is patent. There is a dissection flap in the high cervical internal carotid artery with a proximally 50% narrowing of the true lumen at the site of dissection (8-45). The internal carotid artery distal to this dissection is patent. There is mild beading of the high cervical left internal carotid artery, though to a lesser degree than on the right (13-26). Vertebral arteries: The bilateral vertebral arteries are patent, without hemodynamically significant stenosis, occlusion, dissection, or aneurysm. Skeleton: There is mild degenerative change of the cervical spine at C5-C6 and C6-C7. There is grade 1 anterolisthesis of C3 on C4. There is no acute osseous abnormality or  aggressive osseous lesion. There is no visible canal hematoma. Other neck: The soft tissues are unremarkable. No abnormal mass lesion or fluid collection is seen. Upper chest: The imaged lung apices are clear. CTA HEAD FINDINGS Anterior circulation: Bilateral intracranial ICAs are patent The bilateral MCAs are patent. The bilateral ACAs are patent. The anterior communicating artery is patent. There is no aneurysm. Posterior circulation: The left V4 segment is dominant, a normal variant. The V4 segments are patent. The basilar artery is patent. The bilateral PCAs are patent. The posterior communicating arteries are not identified. There is no aneurysm. Venous sinuses: Patent. Anatomic variants: None. Review of the MIP images confirms the above findings IMPRESSION: 1. 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 2. 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 The internal carotid artery distal to the dissection is patent. No evidence of dissection on the right. 3. Normal intracranial vasculature. Electronically Signed   By: Lesia Hausen M.D.   On: 05/19/2021 15:43   DG Chest 2 View  Result Date: 05/19/2021 CLINICAL DATA:  Right neck pain. EXAM: CHEST - 2 VIEW COMPARISON:  None. FINDINGS: The lungs are clear without focal pneumonia, edema, pneumothorax or pleural effusion. Specifically, no evidence for right apical mass. The cardiopericardial silhouette is within normal limits for size. The visualized bony structures of the thorax show no acute abnormality. IMPRESSION: No active cardiopulmonary disease. Electronically Signed   By: Kennith Center M.D.   On: 05/19/2021 13:45    Procedures Procedures   Medications Ordered in ED Medications  iohexol (OMNIPAQUE) 350 MG/ML injection 65 mL (65 mLs Intravenous Contrast Given 05/19/21 1415)    ED Course  I  have reviewed the triage vital signs and the nursing notes.  Pertinent labs & imaging results that were available during my care of the patient were reviewed by me and considered in my medical decision making (see chart for details).    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  On arrival to the ED today vitals are stable.  Patient appears to be no acute distress.  She has no focal neurodeficits on exam today.  Right ptosis appreciated at this time, slight.  Her pupils are equal round and reactive to light at this time.  We will plan for CTA head and neck per ophthalmology recommendations as well as chest x-ray to assess for Pancoast tumor.  If work-up negative we will likely touch base with ophthalmology for further recommendations.  CXR clear CBC without leukocytosis. Hgb stable at 12.4 BMP with sodium 133. No other electrolyte abnormalities Beta hcg negative  CTAs delayed as pt only initially had CTA neck done. She was taken back to CT per my request for full CTA head.   CTA:   IMPRESSION:  1. 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  2. 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 The internal carotid artery distal to the dissection is  patent. No evidence of dissection on the right.  3. Normal intracranial vasculature.   Discussed case with Dr. Rory Percy Neurologist - recommends Mri Brain with and without contrast and to reconsult afterwards. Symptoms do not seem consistent with acute dissection of left side.   At shift change case signed out to Charmaine Downs, PA-C, who will reconsult neurology after MRI is complete.   This  note was prepared using Dragon voice recognition software and may include unintentional dictation errors due to the inherent limitations of voice recognition software.     Final Clinical Impression(s) / ED Diagnoses Final diagnoses:  None    Rx / DC Orders ED Discharge Orders     None        Eustaquio Maize, PA-C 05/19/21 1614    Kommor, [place], MD 05/23/21 [phone]

## 2021-05-19 NOTE — ED Notes (Signed)
Pt tx to MRI

## 2021-05-19 NOTE — ED Triage Notes (Signed)
Patient complains of right neck pain that started approximately one and a half months ago, then one month ago she started to have blurred vision in her right eye. Patient states she went to her eye doctor today and was sent to [facility] for concern that she may have damaged an artery in her neck one and a half months ago. Patient denies pain, states vision has improved. No arm drift, no slurred speech, no facial droop. Patient is alert, oriented, ambulatory, and in no apparent distress at this time.

## 2021-05-19 NOTE — ED Provider Notes (Signed)
Care assumed from [facility], [place] at shift change pending MRI brain. See her note for full HPI.  In short, patient is a 50 year old female who presents to the ED from ophthalmology to rule out dissection.  Patient endorses right blurry vision associated with droopy right eyelid.  Roughly 1.5 to 2 months ago patient had right-sided neck pain.  Patient seen by ophthalmology today and sent to the ED to rule out carotid artery dissection.  Plan from previous provider: follow-up with MRI results and re-consult neurology.   Physical Exam  BP 118/70 (BP Location: Left Arm)    Pulse 77    Temp 98.8 F (37.1 C) (Oral)    Resp 18    LMP 04/24/2021    SpO2 100%   Physical Exam Vitals and nursing note reviewed.  Constitutional:      General: She is not in acute distress.    Appearance: She is not ill-appearing.  HENT:     Head: Normocephalic.  Eyes:     Pupils: Pupils are equal, round, and reactive to light.     Comments: Ptosis of right eyelid  Cardiovascular:     Rate and Rhythm: Normal rate and regular rhythm.     Pulses: Normal pulses.     Heart sounds: Normal heart sounds. No murmur heard.   No friction rub. No gallop.  Pulmonary:     Effort: Pulmonary effort is normal.     Breath sounds: Normal breath sounds.  Abdominal:     General: Abdomen is flat. There is no distension.     Palpations: Abdomen is soft.     Tenderness: There is no abdominal tenderness. There is no guarding or rebound.  Musculoskeletal:        General: Normal range of motion.     Cervical back: Neck supple.  Skin:    General: Skin is warm and dry.  Neurological:     General: No focal deficit present.     Mental Status: She is alert.     Comments: Speech is clear, able to follow commands CN III-XII intact Normal strength in upper and lower extremities bilaterally including dorsiflexion and plantar flexion, strong and equal grip strength Sensation grossly intact throughout Moves extremities without ataxia,  coordination intact No pronator drift  Psychiatric:        Mood and Affect: Mood normal.        Behavior: Behavior normal.    ED Course/Procedures     Procedures  MDM  50 year old female presents to the ED from ophthalmology to rule out carotid artery dissection.  I reviewed all previous images and labs ordered by previous provider.  CBC unremarkable.  No leukocytosis and normal hemoglobin.  Pregnancy test normal.  BMP with mild hyponatremia 133.  Normal renal function. CTA head/neck personally reviewed which demonstrates:   IMPRESSION:  1. 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  2. 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 The internal carotid artery distal to the dissection is  patent. No evidence of dissection on the right.  3. Normal intracranial vasculature.   Chest x-ray negative for any acute abnormalities.  Previous provider spoke to Dr. Wilford Corner with neurology who recommended MRI brain and reconsult with results.   5:05 PM Discussed with Dr. Wilford Corner with neurology who notes if MRI is negative for CVA patient may be discharged with  ASA 81mg  with outpatient neurology follow-up. If positive, patient will need CVA work-up.   MRI personally reviewed which demonstrates: IMPRESSION: 1. Redemonstrated dissection in the distal left ICA, better visualized on the same-day CTA. 2. No acute intracranial process. No abnormal parenchymal enhancement.   8:28 PM Discussed with Dr. with neurology who reviewed MRI results and recommends ASA 81mg  and outpatient neurology follow-up. Ambulatory referral placed. Strict ED precautions discussed with patient. Patient states understanding and agrees to plan. Patient discharged home in no acute distress and stable vitals      Scherrie November 05/19/21 2031     05/21/21, DO 05/19/21 2231

## 2022-03-01 ENCOUNTER — Other Ambulatory Visit: Payer: Self-pay

## 2022-03-01 ENCOUNTER — Ambulatory Visit
Admission: RE | Admit: 2022-03-01 | Discharge: 2022-03-01 | Disposition: A | Discharge status: Home / Self Care | Payer: BC Managed Care – PPO | Source: Ambulatory Visit | Attending: Family Medicine | Admitting: Family Medicine

## 2022-03-01 VITALS — BP 124/70 | HR 82 | Temp 98.4°F | Resp 20

## 2022-03-01 DIAGNOSIS — Z1152 Encounter for screening for COVID-19: Secondary | ICD-10-CM

## 2022-03-01 DIAGNOSIS — R21 Rash and other nonspecific skin eruption: Secondary | ICD-10-CM

## 2022-03-01 DIAGNOSIS — R519 Headache, unspecified: Secondary | ICD-10-CM | POA: Diagnosis not present

## 2022-03-01 DIAGNOSIS — R197 Diarrhea, unspecified: Secondary | ICD-10-CM | POA: Diagnosis not present

## 2022-03-01 DIAGNOSIS — R52 Pain, unspecified: Secondary | ICD-10-CM | POA: Diagnosis not present

## 2022-03-01 LAB — RESP PANEL BY RT-PCR (FLU A&B, COVID) ARPGX2
Influenza A by PCR: NEGATIVE
Influenza B by PCR: NEGATIVE
SARS Coronavirus 2 by RT PCR: NEGATIVE

## 2022-03-01 MED ORDER — TRIAMCINOLONE ACETONIDE 0.1 % EX CREA: 1.0000 | TOPICAL_CREAM | Freq: Two times a day (BID) | CUTANEOUS | 0 refills | Status: DC

## 2022-03-01 MED ORDER — LOPERAMIDE HCL 2 MG PO CAPS: 2.0000 mg | ORAL_CAPSULE | Freq: Four times a day (QID) | ORAL | 0 refills | Status: DC | PRN

## 2022-03-01 NOTE — ED Provider Notes (Signed)
[facility]    CSN: [id number] Arrival date & time: 03/01/22  1040      History   Chief Complaint Chief Complaint  Patient presents with   Headache    Headaches, fatigue,  diarrhea and rash. Concerned about shingles. - Entered by patient    HPI Sharon Gillespie is a 51 y.o. female.   Presenting today with 3 to 4-day history of headache, fatigue, generalized body aches, diarrhea, and then yesterday started with a rash on her chest that has all but resolved.  She denies cough, congestion, sore throat, abdominal pain, nausea, vomiting.  Multiple sick contacts at work but unsure with what.  So far has not been trying anything other than Tylenol for symptoms.  Tolerating p.o. well.    No past medical history on file.  There are no problems to display for this patient.   No past surgical history on file.  OB History   No obstetric history on file.      Home Medications    Prior to Admission medications   Medication Sig Start Date End Date Taking? Authorizing Provider  loperamide (IMODIUM) 2 MG capsule Take 1 capsule (2 mg total) by mouth 4 (four) times daily as needed for diarrhea or loose stools. 03/01/22  Yes Particia Nearing, PA-C  triamcinolone cream (KENALOG) 0.1 % Apply 1 Application topically 2 (two) times daily. 03/01/22  Yes Particia Nearing, PA-C  amoxicillin-clavulanate (AUGMENTIN) 875-125 MG tablet Take 1 tablet by mouth every 12 (twelve) hours. 08/03/20   Avegno, Zachery Dakins, FNP  aspirin 81 MG EC tablet Take 1 tablet (81 mg total) by mouth daily. Swallow whole. 05/19/21   Mannie Stabile, PA-C  ciprofloxacin (CIPRO) 500 MG tablet ciprofloxacin 500 mg tablet  TAKE 1 TABLET TWICE A DAY    [provider]  fluticasone (FLONASE) 50 MCG/ACT nasal spray Place 1 spray into both nostrils daily for 14 days. 08/03/20 08/17/20  Avegno, Zachery Dakins, FNP  nitrofurantoin, macrocrystal-monohydrate, (MACROBID) 100 MG capsule Take 1 capsule (100 mg  total) by mouth 2 (two) times daily. 11/10/20   Merrilee Jansky, MD  norethindrone-ethinyl estradiol 1/35 (ALAYCEN 1/35) tablet Take 1 tablet by mouth daily.    [provider]  phentermine (ADIPEX-P) 37.5 MG tablet Take 37.5 mg by mouth every morning. 05/04/21   [provider]  predniSONE (DELTASONE) 10 MG tablet Take 2 tablets (20 mg total) by mouth daily. 08/03/20   Avegno, Zachery Dakins, FNP  pseudoephedrine (SUDAFED) 30 MG tablet Take 30 mg by mouth every 4 (four) hours as needed for congestion.    [provider]  sertraline (ZOLOFT) 100 MG tablet Take 100 mg by mouth daily.    [provider]    Family History Family History  Family history unknown: Yes    Social History Social History   Tobacco Use   Smoking status: Every Day    Packs/day: 0.50    Types: Cigarettes   Smokeless tobacco: Never  Substance Use Topics   Alcohol use: Not Currently   Drug use: Never     Allergies   Patient has no known allergies.   Review of Systems Review of Systems Per HPI  Physical Exam Triage Vital Signs ED Triage Vitals  Enc Vitals Group     BP 03/01/22 1140 124/70     Pulse Rate 03/01/22 1140 82     Resp 03/01/22 1140 20     Temp 03/01/22 1140 98.4 F (36.9 C)  Temp Source 03/01/22 1140 Oral     SpO2 03/01/22 1140 98 %     Weight --      Height --      Head Circumference --      Peak Flow --      Pain Score 03/01/22 1137 2     Pain Loc --      Pain Edu? --      Excl. in GC? --    No data found.  Updated Vital Signs BP 124/70 (BP Location: Right Arm)   Pulse 82   Temp 98.4 F (36.9 C) (Oral)   Resp 20   SpO2 98%   Visual Acuity Right Eye Distance:   Left Eye Distance:   Bilateral Distance:    Right Eye Near:   Left Eye Near:    Bilateral Near:     Physical Exam Vitals and nursing note reviewed.  Constitutional:      Appearance: Normal appearance.  HENT:     Head: Atraumatic.     Right Ear: Tympanic membrane and  external ear normal.     Left Ear: Tympanic membrane and external ear normal.     Nose: Nose normal.     Mouth/Throat:     Mouth: Mucous membranes are moist.     Pharynx: No oropharyngeal exudate or posterior oropharyngeal erythema.  Eyes:     Extraocular Movements: Extraocular movements intact.     Conjunctiva/sclera: Conjunctivae normal.  Cardiovascular:     Rate and Rhythm: Normal rate and regular rhythm.     Heart sounds: Normal heart sounds.  Pulmonary:     Effort: Pulmonary effort is normal.     Breath sounds: Normal breath sounds. No wheezing.  Abdominal:     General: Bowel sounds are normal. There is no distension.     Palpations: Abdomen is soft.     Tenderness: There is no abdominal tenderness. There is no guarding.  Musculoskeletal:        General: Normal range of motion.     Cervical back: Normal range of motion and neck supple.  Skin:    General: Skin is warm and dry.     Findings: Rash present.     Comments: Scattered erythematous papular rash to chest bilaterally  Neurological:     Mental Status: She is alert and oriented to person, place, and time. Mental status is at baseline.     Motor: No weakness.     Gait: Gait normal.  Psychiatric:        Mood and Affect: Mood normal.        Thought Content: Thought content normal.      UC Treatments / Results  Labs (all labs ordered are listed, but only abnormal results are displayed) Labs Reviewed  RESP PANEL BY RT-PCR (FLU A&B, COVID) [id number]    EKG   Radiology No results found.  Procedures Procedures (including critical care time)  Medications Ordered in UC Medications - No data to display  Initial Impression / Assessment and Plan / UC Course  I have reviewed the triage vital signs and the nursing notes.  Pertinent labs & imaging results that were available during my care of the patient were reviewed by me and considered in my medical decision making (see chart for details).     Suspect viral  illness causing her symptoms, respiratory panel pending, treat with Imodium, over-the-counter pain and fever reducers, fluids, rest.  Regarding her rash, nonspecific but is itchy so we will  treat with triamcinolone to help to fully resolve.  Return for any worsening symptoms.  Work note given.  Final Clinical Impressions(s) / UC Diagnoses   Final diagnoses:  Encounter for screening for COVID-19  Acute nonintractable headache, unspecified headache type  Generalized body aches  Diarrhea, unspecified type  Rash and nonspecific skin eruption   Discharge Instructions   None    ED Prescriptions     Medication Sig Dispense Auth. Provider   loperamide (IMODIUM) 2 MG capsule Take 1 capsule (2 mg total) by mouth 4 (four) times daily as needed for diarrhea or loose stools. 12 capsule Volney American, [place]   triamcinolone cream (KENALOG) 0.1 % Apply 1 Application topically 2 (two) times daily. 60 g Volney American, [place]      PDMP not reviewed this encounter.   Volney American, [place] 03/01/22 1318

## 2022-03-01 NOTE — ED Triage Notes (Signed)
Pt reports headache, fatigue, diarrhea for last several days. Pt reports rash appeared to anterior chest but reports has resolved.

## 2022-06-21 LAB — COLOGUARD

## 2022-06-21 LAB — EXTERNAL GENERIC LAB PROCEDURE

## 2022-07-01 IMAGING — DX DG CHEST 2V
2 series · 2 of 2 positions shown · non-contrast
Comparison: None.

CLINICAL DATA: Right neck pain.

EXAM:
CHEST - 2 VIEW

[w chest pa]
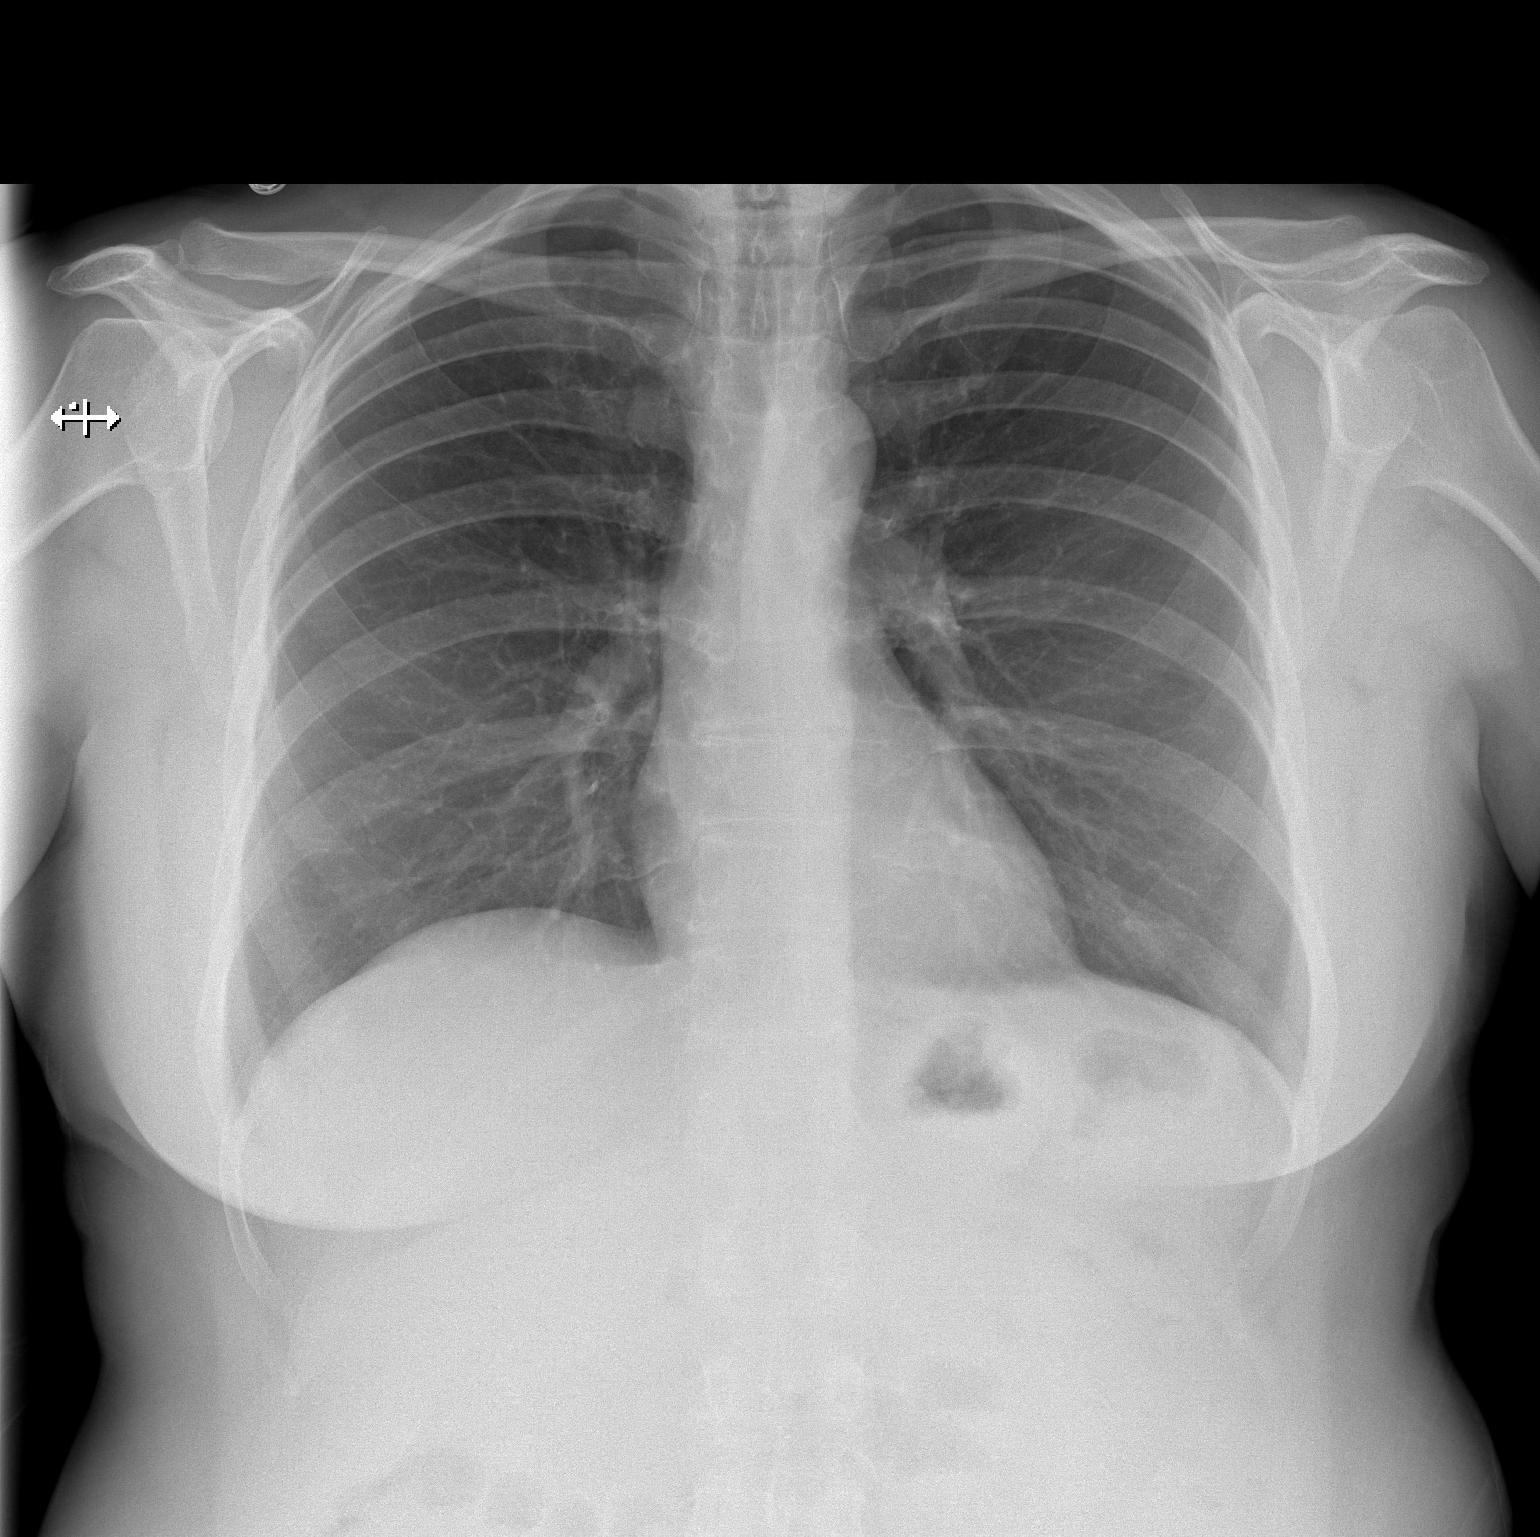

[w chest lat]
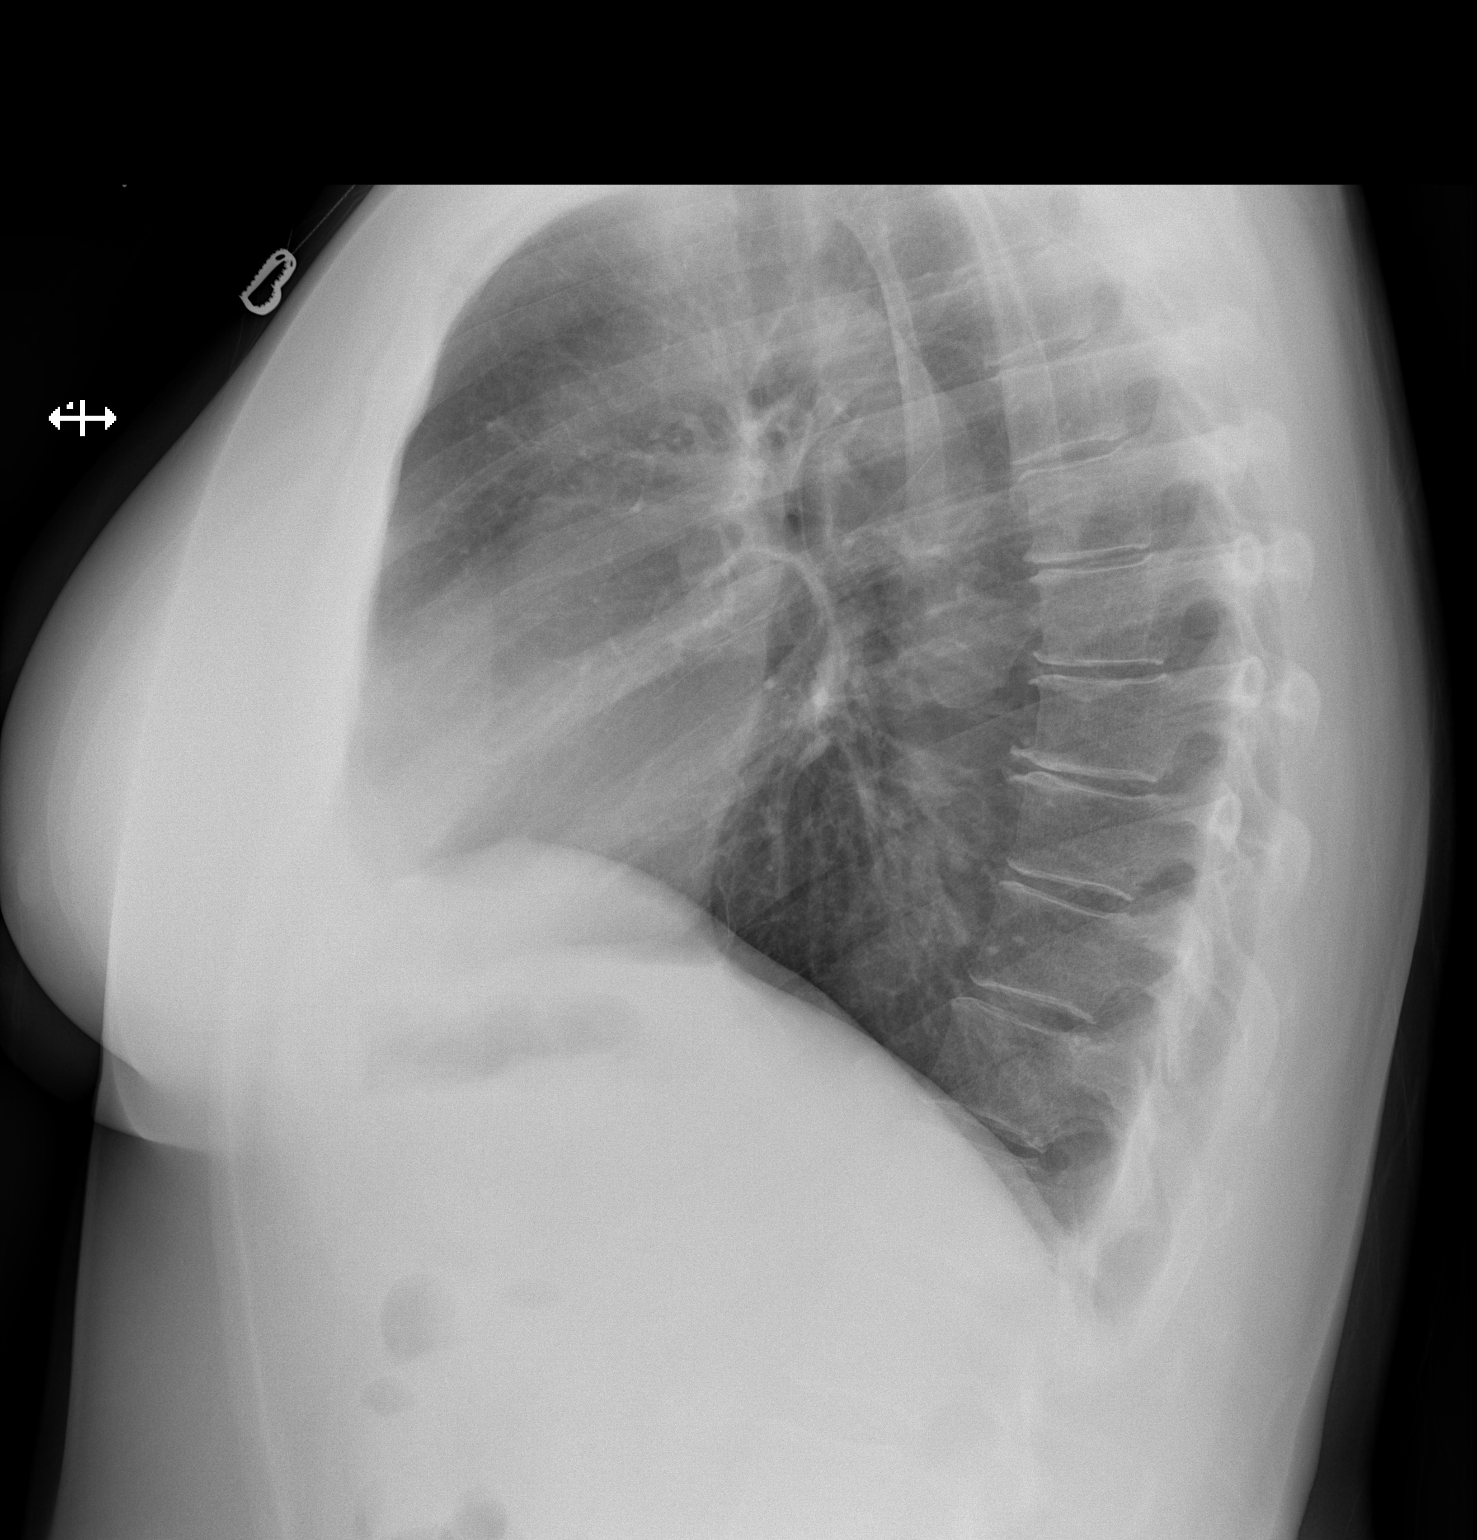

[2 of 2 positions shown; findings below may reference images not displayed]

FINDINGS: The lungs are clear without focal pneumonia, edema, pneumothorax or
pleural effusion. Specifically, no evidence for right apical mass.
The cardiopericardial silhouette is within normal limits for size.
The visualized bony structures of the thorax show no acute
abnormality.
IMPRESSION: No active cardiopulmonary disease.

## 2023-03-26 ENCOUNTER — Encounter (HOSPITAL_COMMUNITY): Payer: Self-pay

## 2023-03-26 ENCOUNTER — Other Ambulatory Visit: Payer: Self-pay

## 2023-03-26 ENCOUNTER — Emergency Department (HOSPITAL_COMMUNITY)
Admission: EM | Admit: 2023-03-26 | Discharge: 2023-03-26 | Disposition: A | Discharge status: Home / Self Care | Payer: BC Managed Care – PPO | Attending: Emergency Medicine | Admitting: Emergency Medicine

## 2023-03-26 ENCOUNTER — Ambulatory Visit
Admission: RE | Admit: 2023-03-26 | Discharge: 2023-03-26 | Disposition: A | Discharge status: Home / Self Care | Payer: BC Managed Care – PPO | Source: Ambulatory Visit | Attending: Nurse Practitioner | Admitting: Nurse Practitioner

## 2023-03-26 VITALS — BP 94/56 | HR 77 | Temp 98.0°F | Resp 15

## 2023-03-26 DIAGNOSIS — R109 Unspecified abdominal pain: Secondary | ICD-10-CM

## 2023-03-26 DIAGNOSIS — R197 Diarrhea, unspecified: Secondary | ICD-10-CM

## 2023-03-26 DIAGNOSIS — Z5321 Procedure and treatment not carried out due to patient leaving prior to being seen by health care provider: Secondary | ICD-10-CM | POA: Diagnosis not present

## 2023-03-26 LAB — COMPREHENSIVE METABOLIC PANEL
ALT: 17 U/L (ref 0–44)
AST: 22 U/L (ref 15–41)
Albumin: 3.5 g/dL (ref 3.5–5.0)
Alkaline Phosphatase: 52 U/L (ref 38–126)
Anion gap: 11 (ref 5–15)
BUN: 9 mg/dL (ref 6–20)
CO2: 22 mmol/L (ref 22–32)
Calcium: 8.7 mg/dL — ABNORMAL LOW (ref 8.9–10.3)
Chloride: 103 mmol/L (ref 98–111)
Creatinine, Ser: 0.96 mg/dL (ref 0.44–1.00)
GFR, Estimated: >60 mL/min (ref >60)
Glucose, Bld: 83 mg/dL (ref 70–99)
Potassium: 3.1 mmol/L — ABNORMAL LOW (ref 3.5–5.1)
Sodium: 136 mmol/L (ref 135–145)
Total Bilirubin: 0.3 mg/dL (ref 0.3–1.2)
Total Protein: 6.6 g/dL (ref 6.5–8.1)

## 2023-03-26 LAB — CBC
HCT: 37.1 % (ref 36.0–46.0)
Hemoglobin: 12.4 g/dL (ref 12.0–15.0)
MCH: 30 pg (ref 26.0–34.0)
MCHC: 33.4 g/dL (ref 30.0–36.0)
MCV: 89.6 fL (ref 80.0–100.0)
Platelets: 272 10*3/uL (ref 150–400)
RBC: 4.14 MIL/uL (ref 3.87–5.11)
RDW: 13.3 % (ref 11.5–15.5)
WBC: 6.5 10*3/uL (ref 4.0–10.5)
nRBC: 0 % (ref 0.0–0.2)

## 2023-03-26 LAB — LIPASE, BLOOD: Lipase: 33 U/L (ref 11–51)

## 2023-03-26 LAB — POCT URINALYSIS DIP (MANUAL ENTRY)
Glucose, UA: NEGATIVE mg/dL
Ketones, POC UA: NEGATIVE mg/dL
Leukocytes, UA: NEGATIVE
Nitrite, UA: NEGATIVE
Protein Ur, POC: 30 mg/dL — AB
Spec Grav, UA: 1.025 (ref 1.010–1.025)
Urobilinogen, UA: 0.2 U/dL
pH, UA: 6 (ref 5.0–8.0)

## 2023-03-26 NOTE — ED Provider Notes (Signed)
[facility]    CSN: [id number] Arrival date & time: 03/26/23  1133      History   Chief Complaint Chief Complaint  Patient presents with   Diarrhea    Stomach flu symptoms with fever. - Entered by patient    HPI Sharon Gillespie is a 52 y.o. female.   The history is provided by the patient.   Patient presents for complaints of diarrhea, abdominal pain, and low-grade fever.  Symptoms started over the past 2 to 3 days.  Tmax 100.5.  Patient reports having diarrhea at least 1 time every hour since her symptoms started.  She complains of lower abdominal pain, describes the pain as "a bad cramp."  She states that the abdominal pain starts as soon as she finishes eating, and with the diarrhea episodes.  Patient denies chills, chest pain, nausea, vomiting, urinary symptoms, bloody stools, melena stools, or other GI history.  Patient reports that she has tried drinking fluids, she also reports that she has been eating popsicles.  She has not taken any medication for her symptoms.  Patient cannot recall eating any foods or other triggers that may have caused her symptoms.  History reviewed. No pertinent past medical history.  There are no problems to display for this patient.   History reviewed. No pertinent surgical history.  OB History   No obstetric history on file.      Home Medications    Prior to Admission medications   Medication Sig Start Date End Date Taking? Authorizing Provider  amoxicillin-clavulanate (AUGMENTIN) 875-125 MG tablet Take 1 tablet by mouth every 12 (twelve) hours. 08/03/20   Avegno, Zachery Dakins, FNP  aspirin 81 MG EC tablet Take 1 tablet (81 mg total) by mouth daily. Swallow whole. 05/19/21   Mannie Stabile, PA-C  ciprofloxacin (CIPRO) 500 MG tablet ciprofloxacin 500 mg tablet  TAKE 1 TABLET TWICE A DAY    [provider]  fluticasone (FLONASE) 50 MCG/ACT nasal spray Place 1 spray into both nostrils daily for 14 days. 08/03/20 08/17/20   Avegno, Zachery Dakins, FNP  loperamide (IMODIUM) 2 MG capsule Take 1 capsule (2 mg total) by mouth 4 (four) times daily as needed for diarrhea or loose stools. 03/01/22   Particia Nearing, PA-C  nitrofurantoin, macrocrystal-monohydrate, (MACROBID) 100 MG capsule Take 1 capsule (100 mg total) by mouth 2 (two) times daily. 11/10/20   Merrilee Jansky, MD  norethindrone-ethinyl estradiol 1/35 (ALAYCEN 1/35) tablet Take 1 tablet by mouth daily.    [provider]  phentermine (ADIPEX-P) 37.5 MG tablet Take 37.5 mg by mouth every morning. 05/04/21   [provider]  predniSONE (DELTASONE) 10 MG tablet Take 2 tablets (20 mg total) by mouth daily. 08/03/20   Avegno, Zachery Dakins, FNP  pseudoephedrine (SUDAFED) 30 MG tablet Take 30 mg by mouth every 4 (four) hours as needed for congestion.    [provider]  sertraline (ZOLOFT) 100 MG tablet Take 100 mg by mouth daily.    [provider]  triamcinolone cream (KENALOG) 0.1 % Apply 1 Application topically 2 (two) times daily. 03/01/22   Particia Nearing, PA-C    Family History Family History  Family history unknown: Yes    Social History Social History   Tobacco Use   Smoking status: Every Day    Current packs/day: 0.50    Types: Cigarettes   Smokeless tobacco: Never  Substance Use Topics   Alcohol use: Not Currently   Drug use: Never  Allergies   Patient has no known allergies.   Review of Systems Review of Systems Per HPI  Physical Exam Triage Vital Signs ED Triage Vitals  Encounter Vitals Group     BP 03/26/23 1143 (!) 93/54     Systolic BP Percentile --      Diastolic BP Percentile --      Pulse Rate 03/26/23 1143 77     Resp 03/26/23 1143 15     Temp 03/26/23 1143 98 F (36.7 C)     Temp Source 03/26/23 1143 Oral     SpO2 03/26/23 1143 96 %     Weight --      Height --      Head Circumference --      Peak Flow --      Pain Score 03/26/23 1145 0     Pain Loc --      Pain  Education --      Exclude from Growth Chart --    No data found.  Updated Vital Signs BP (!) 94/56 (BP Location: Right Arm)   Pulse 77   Temp 98 F (36.7 C) (Oral)   Resp 15   LMP 02/25/2023 (Exact Date)   SpO2 96%   Visual Acuity Right Eye Distance:   Left Eye Distance:   Bilateral Distance:    Right Eye Near:   Left Eye Near:    Bilateral Near:     Physical Exam Vitals and nursing note reviewed.  Constitutional:      General: She is not in acute distress.    Appearance: Normal appearance.  HENT:     Head: Normocephalic.  Eyes:     Extraocular Movements: Extraocular movements intact.     Conjunctiva/sclera: Conjunctivae normal.     Pupils: Pupils are equal, round, and reactive to light.  Cardiovascular:     Rate and Rhythm: Normal rate and regular rhythm.     Pulses: Normal pulses.     Heart sounds: Normal heart sounds.  Pulmonary:     Effort: Pulmonary effort is normal.     Breath sounds: Normal breath sounds.  Abdominal:     General: Bowel sounds are normal. There is no distension.     Palpations: Abdomen is soft.     Tenderness: There is abdominal tenderness in the right lower quadrant and left lower quadrant. There is no right CVA tenderness, left CVA tenderness, guarding or rebound. Negative signs include Murphy's sign and McBurney's sign.  Musculoskeletal:     Cervical back: Normal range of motion.  Lymphadenopathy:     Cervical: No cervical adenopathy.  Skin:    General: Skin is warm and dry.  Neurological:     General: No focal deficit present.     Mental Status: She is alert and oriented to person, place, and time.  Psychiatric:        Mood and Affect: Mood normal.        Behavior: Behavior normal.      UC Treatments / Results  Labs (all labs ordered are listed, but only abnormal results are displayed) Labs Reviewed  POCT URINALYSIS DIP (MANUAL ENTRY) - Abnormal; Notable for the following components:      Result Value   Clarity, UA hazy (*)     Bilirubin, UA small (*)    Blood, UA large (*)    Protein Ur, POC =30 (*)    All other components within normal limits    EKG   Radiology No results  found.  Procedures Procedures (including critical care time)  Medications Ordered in UC Medications - No data to display  Initial Impression / Assessment and Plan / UC Course  I have reviewed the triage vital signs and the nursing notes.  Pertinent labs & imaging results that were available during my care of the patient were reviewed by me and considered in my medical decision making (see chart for details).  Patient with diarrhea that is been present for the past 2 to 3 days.  During triage, initial BP 93/54.  BP was repeated with highest 94/56.  Given that patient reports that she has had consistent diarrhea for the past 2 to 3 days, cannot rule out electrolyte imbalance.  Based on patient's blood pressure, there is concern for dehydration, despite the specific gravity on her urinalysis being normal.  Discussed concerns for the patient along with the recommendation.  Advised patient that it is recommended that he should she go to the hospital for further evaluation.  Patient was in agreement with this plan of care and verbalizes understanding.  All questions were answered.  Patient is stable for discharge.  Patient discharged to the emergency department.   Final Clinical Impressions(s) / UC Diagnoses   Final diagnoses:  None     Discharge Instructions      Go to the emergency department for further evaluation.     ED Prescriptions   None    PDMP not reviewed this encounter.   Abran Cantor, NP 03/26/23 1228

## 2023-03-26 NOTE — ED Triage Notes (Signed)
Pt was seen at urgent care this morning for c/o abd pain & diarrhea x 4 days.  Pt reports having to have bm every time she eats anything.  Reports at least 20 BM in last 24 hours.  Reports multiple coworkers out for various illness. Pt states she feels fine other than the multiple bowel movements.

## 2023-03-26 NOTE — Discharge Instructions (Signed)
Go to the emergency department for further evaluation

## 2023-03-26 NOTE — ED Notes (Addendum)
Patient is being discharged from the Urgent Care and sent to the Emergency Department via POV . Per Devra Dopp NP, patient is in need of higher level of care due to Diarrhea x 4 days, low Blood pressure . Patient is aware and verbalizes understanding of plan of care.  Vitals:   03/26/23 1144 03/26/23 1150  BP: (!) 86/54 (!) 94/56  Pulse:    Resp:    Temp:    SpO2:

## 2023-03-26 NOTE — ED Triage Notes (Signed)
Pt c/o low grade fever, abdominal pain and diarrhea, x 4 days.

## 2023-03-28 ENCOUNTER — Ambulatory Visit: Payer: Self-pay

## 2023-03-28 ENCOUNTER — Other Ambulatory Visit: Payer: Self-pay

## 2023-03-28 ENCOUNTER — Ambulatory Visit
Admission: EM | Admit: 2023-03-28 | Discharge: 2023-03-28 | Disposition: A | Discharge status: Home / Self Care | Payer: BC Managed Care – PPO | Attending: Family Medicine | Admitting: Family Medicine

## 2023-03-28 ENCOUNTER — Encounter: Payer: Self-pay | Admitting: Emergency Medicine

## 2023-03-28 DIAGNOSIS — Z87891 Personal history of nicotine dependence: Secondary | ICD-10-CM | POA: Insufficient documentation

## 2023-03-28 DIAGNOSIS — R109 Unspecified abdominal pain: Secondary | ICD-10-CM | POA: Diagnosis not present

## 2023-03-28 DIAGNOSIS — E876 Hypokalemia: Secondary | ICD-10-CM | POA: Insufficient documentation

## 2023-03-28 DIAGNOSIS — R197 Diarrhea, unspecified: Secondary | ICD-10-CM | POA: Diagnosis present

## 2023-03-28 HISTORY — DX: Anxiety disorder, unspecified: F41.9

## 2023-03-28 LAB — C DIFFICILE QUICK SCREEN W PCR REFLEX
C Diff antigen: NEGATIVE
C Diff interpretation: NOT DETECTED
C Diff toxin: NEGATIVE

## 2023-03-28 MED ORDER — LOPERAMIDE HCL 2 MG PO CAPS: 2.0000 mg | ORAL_CAPSULE | Freq: Four times a day (QID) | ORAL | 0 refills | Status: DC | PRN

## 2023-03-28 MED ORDER — AZITHROMYCIN 500 MG PO TABS: 500.0000 mg | ORAL_TABLET | Freq: Every day | ORAL | 0 refills | Status: DC

## 2023-03-28 NOTE — Discharge Instructions (Signed)
Do not start your antibiotic until you have collected your stool kit.

## 2023-03-28 NOTE — ED Notes (Signed)
Pt was given stool kit, instructions provided. Pt verbalized understand.

## 2023-03-28 NOTE — ED Triage Notes (Signed)
Pt reports diarrhea since Saturday. Pt reports was seen at [facility] and ED and reports left ED prior to getting labs reviewed.   Pt reports recently had a family member pass and is concerned may be contagious, "I just need an idea of what's going on and some help."

## 2023-03-29 ENCOUNTER — Telehealth (HOSPITAL_COMMUNITY): Payer: Self-pay | Admitting: Emergency Medicine

## 2023-03-29 LAB — GASTROINTESTINAL PANEL BY PCR, STOOL (REPLACES STOOL CULTURE)

## 2023-03-29 MED ORDER — LEVOFLOXACIN 500 MG PO TABS: 500.0000 mg | ORAL_TABLET | Freq: Every day | ORAL | 0 refills | Status: AC

## 2023-03-29 NOTE — Telephone Encounter (Signed)
Levaquin for positive Salmonella, per Lillia Abed, APP

## 2023-04-01 NOTE — ED Provider Notes (Signed)
[facility]    CSN: [id number] Arrival date & time: 03/28/23  8416      History   Chief Complaint Chief Complaint  Patient presents with   Diarrhea    HPI Sharon Gillespie is a 52 y.o. female.   Patient presenting today with 4 to 5-day history of diarrhea, lower abdominal cramping, fatigue.  Denies fever, chills, severe abdominal pain, vomiting, upper respiratory symptoms, dizziness, new dietary changes, new medications, recent sick contacts.  States she was previously having watery stools about 20 times per day for the first few days but is now having them less than 10 times per day but certainly every time she eats is causing watery stools.  So far not trying anything over-the-counter for symptoms.  She was triaged in the emergency department but did not stay to be seen on 03/26/2023.  Stool studies were ordered but not collected, labs were collected and overall reassuring apart from mildly low potassium.  No known past history of chronic GI issues.    Past Medical History:  Diagnosis Date   Anxiety     There are no problems to display for this patient.   History reviewed. No pertinent surgical history.  OB History   No obstetric history on file.      Home Medications    Prior to Admission medications   Medication Sig Start Date End Date Taking? Authorizing Provider  loperamide (IMODIUM) 2 MG capsule Take 1 capsule (2 mg total) by mouth 4 (four) times daily as needed for diarrhea or loose stools. 03/28/23  Yes Particia Nearing, PA-C  amoxicillin-clavulanate (AUGMENTIN) 875-125 MG tablet Take 1 tablet by mouth every 12 (twelve) hours. 08/03/20   Avegno, Zachery Dakins, FNP  aspirin 81 MG EC tablet Take 1 tablet (81 mg total) by mouth daily. Swallow whole. 05/19/21   Mannie Stabile, PA-C  ciprofloxacin (CIPRO) 500 MG tablet ciprofloxacin 500 mg tablet  TAKE 1 TABLET TWICE A DAY    [provider]  fluticasone (FLONASE) 50 MCG/ACT nasal spray  Place 1 spray into both nostrils daily for 14 days. 08/03/20 08/17/20  Avegno, Zachery Dakins, FNP  levofloxacin (LEVAQUIN) 500 MG tablet Take 1 tablet (500 mg total) by mouth daily for 7 days. 03/29/23 04/05/23  Theadora Rama Scales, PA-C  loperamide (IMODIUM) 2 MG capsule Take 1 capsule (2 mg total) by mouth 4 (four) times daily as needed for diarrhea or loose stools. 03/01/22   Particia Nearing, PA-C  nitrofurantoin, macrocrystal-monohydrate, (MACROBID) 100 MG capsule Take 1 capsule (100 mg total) by mouth 2 (two) times daily. 11/10/20   Merrilee Jansky, MD  norethindrone-ethinyl estradiol 1/35 (ALAYCEN 1/35) tablet Take 1 tablet by mouth daily.    [provider]  phentermine (ADIPEX-P) 37.5 MG tablet Take 37.5 mg by mouth every morning. 05/04/21   [provider]  predniSONE (DELTASONE) 10 MG tablet Take 2 tablets (20 mg total) by mouth daily. 08/03/20   Avegno, Zachery Dakins, FNP  pseudoephedrine (SUDAFED) 30 MG tablet Take 30 mg by mouth every 4 (four) hours as needed for congestion.    [provider]  sertraline (ZOLOFT) 100 MG tablet Take 100 mg by mouth daily.    [provider]  triamcinolone cream (KENALOG) 0.1 % Apply 1 Application topically 2 (two) times daily. 03/01/22   Particia Nearing, PA-C    Family History Family History  Family history unknown: Yes    Social History Social History   Tobacco Use  Smoking status: Former    Types: Cigarettes   Smokeless tobacco: Never  Substance Use Topics   Alcohol use: Not Currently   Drug use: Never     Allergies   Patient has no known allergies.   Review of Systems Review of Systems Per HPI  Physical Exam Triage Vital Signs ED Triage Vitals  Encounter Vitals Group     BP 03/28/23 0935 120/85     Systolic BP Percentile --      Diastolic BP Percentile --      Pulse Rate 03/28/23 0935 79     Resp 03/28/23 0935 16     Temp 03/28/23 0935 98.5 F (36.9 C)     Temp Source 03/28/23  0935 Oral     SpO2 03/28/23 0935 96 %     Weight --      Height --      Head Circumference --      Peak Flow --      Pain Score 03/28/23 0936 0     Pain Loc --      Pain Education --      Exclude from Growth Chart --    No data found.  Updated Vital Signs BP 120/85 (BP Location: Right Arm)   Pulse 79   Temp 98.5 F (36.9 C) (Oral)   Resp 16   LMP 03/26/2023 (Exact Date)   SpO2 96%   Visual Acuity Right Eye Distance:   Left Eye Distance:   Bilateral Distance:    Right Eye Near:   Left Eye Near:    Bilateral Near:     Physical Exam Vitals and nursing note reviewed.  Constitutional:      Appearance: Normal appearance. She is not ill-appearing.  HENT:     Head: Atraumatic.  Eyes:     Extraocular Movements: Extraocular movements intact.     Conjunctiva/sclera: Conjunctivae normal.  Cardiovascular:     Rate and Rhythm: Normal rate and regular rhythm.     Heart sounds: Normal heart sounds.  Pulmonary:     Effort: Pulmonary effort is normal.     Breath sounds: Normal breath sounds.  Abdominal:     General: Bowel sounds are normal. There is no distension.     Palpations: Abdomen is soft.     Tenderness: There is no abdominal tenderness. There is no right CVA tenderness, left CVA tenderness or guarding.  Musculoskeletal:        General: Normal range of motion.     Cervical back: Normal range of motion and neck supple.  Skin:    General: Skin is warm and dry.  Neurological:     Mental Status: She is alert and oriented to person, place, and time.  Psychiatric:        Mood and Affect: Mood normal.        Thought Content: Thought content normal.        Judgment: Judgment normal.      UC Treatments / Results  Labs (all labs ordered are listed, but only abnormal results are displayed) Labs Reviewed  GASTROINTESTINAL PANEL BY PCR, STOOL (REPLACES STOOL CULTURE) - Abnormal; Notable for the following components:      Result Value   Salmonella species DETECTED (*)     All other components within normal limits  C DIFFICILE QUICK SCREEN W PCR REFLEX    MISCELLANEOUS TEST    EKG   Radiology No results found.  Procedures Procedures (including critical care time)  Medications Ordered  in UC Medications - No data to display  Initial Impression / Assessment and Plan / UC Course  I have reviewed the triage vital signs and the nursing notes.  Pertinent labs & imaging results that were available during my care of the patient were reviewed by me and considered in my medical decision making (see chart for details).     Vital signs overall reassuring today and she appears in no acute distress.  Her abdomen is nontender with no red flag findings.  Stool studies ordered today given duration and severity of symptoms.  Will start antibiotics empirically while awaiting these results and adjust if needed.  Imodium as needed, brat diet, fluids.  Return for any worsening symptoms.  Final Clinical Impressions(s) / UC Diagnoses   Final diagnoses:  Diarrhea of presumed infectious origin  Hypokalemia     Discharge Instructions      Do not start your antibiotic until you have collected your stool kit.    ED Prescriptions     Medication Sig Dispense Auth. Provider   azithromycin (ZITHROMAX) 500 MG tablet Take 1 tablet (500 mg total) by mouth daily. 3 tablet Particia Nearing, [place]   loperamide (IMODIUM) 2 MG capsule Take 1 capsule (2 mg total) by mouth 4 (four) times daily as needed for diarrhea or loose stools. 12 capsule Particia Nearing, [place]      PDMP not reviewed this encounter.   Roosvelt Maser [place], [place] 04/01/23 [phone]

## 2023-05-03 ENCOUNTER — Ambulatory Visit
Admission: RE | Admit: 2023-05-03 | Discharge: 2023-05-03 | Disposition: A | Discharge status: Home / Self Care | Payer: BC Managed Care – PPO | Source: Ambulatory Visit | Attending: Family Medicine

## 2023-05-03 VITALS — BP 122/75 | HR 81 | Temp 98.4°F | Resp 20

## 2023-05-03 DIAGNOSIS — N390 Urinary tract infection, site not specified: Secondary | ICD-10-CM | POA: Insufficient documentation

## 2023-05-03 LAB — POCT URINALYSIS DIP (MANUAL ENTRY)
Bilirubin, UA: NEGATIVE
Glucose, UA: NEGATIVE mg/dL
Ketones, POC UA: NEGATIVE mg/dL
Nitrite, UA: POSITIVE — AB
Protein Ur, POC: 100 mg/dL — AB
Spec Grav, UA: >=1.03 — AB (ref 1.010–1.025)
Urobilinogen, UA: 0.2 U/dL
pH, UA: 6 (ref 5.0–8.0)

## 2023-05-03 MED ORDER — CEPHALEXIN 500 MG PO CAPS: 500.0000 mg | ORAL_CAPSULE | Freq: Two times a day (BID) | ORAL | 0 refills | Status: DC

## 2023-05-03 NOTE — ED Provider Notes (Signed)
[facility]    CSN: [id number] Arrival date & time: 05/03/23  1101      History   Chief Complaint Chief Complaint  Patient presents with   Urinary Frequency    Blood in urine and pain. - Entered by patient    HPI Sharon Gillespie is a 52 y.o. female.   Patient presenting today with 2-day history of hematuria, urgency, dysuria.  Denies fever, chills, flank pain, nausea, vomiting, vaginal symptoms.  So far trying over-the-counter remedies with minimal relief.    Past Medical History:  Diagnosis Date   Anxiety     There are no problems to display for this patient.   History reviewed. No pertinent surgical history.  OB History   No obstetric history on file.      Home Medications    Prior to Admission medications   Medication Sig Start Date End Date Taking? Authorizing Provider  cephALEXin (KEFLEX) 500 MG capsule Take 1 capsule (500 mg total) by mouth 2 (two) times daily. 05/03/23  Yes Particia Nearing, PA-C  amoxicillin-clavulanate (AUGMENTIN) 875-125 MG tablet Take 1 tablet by mouth every 12 (twelve) hours. 08/03/20   Avegno, Zachery Dakins, FNP  aspirin 81 MG EC tablet Take 1 tablet (81 mg total) by mouth daily. Swallow whole. 05/19/21   Mannie Stabile, PA-C  ciprofloxacin (CIPRO) 500 MG tablet ciprofloxacin 500 mg tablet  TAKE 1 TABLET TWICE A DAY    [provider]  fluticasone (FLONASE) 50 MCG/ACT nasal spray Place 1 spray into both nostrils daily for 14 days. 08/03/20 08/17/20  Avegno, Zachery Dakins, FNP  loperamide (IMODIUM) 2 MG capsule Take 1 capsule (2 mg total) by mouth 4 (four) times daily as needed for diarrhea or loose stools. 03/01/22   Particia Nearing, PA-C  loperamide (IMODIUM) 2 MG capsule Take 1 capsule (2 mg total) by mouth 4 (four) times daily as needed for diarrhea or loose stools. 03/28/23   Particia Nearing, PA-C  nitrofurantoin, macrocrystal-monohydrate, (MACROBID) 100 MG capsule Take 1 capsule (100 mg total) by  mouth 2 (two) times daily. 11/10/20   Merrilee Jansky, MD  norethindrone-ethinyl estradiol 1/35 (ALAYCEN 1/35) tablet Take 1 tablet by mouth daily.    [provider]  phentermine (ADIPEX-P) 37.5 MG tablet Take 37.5 mg by mouth every morning. 05/04/21   [provider]  predniSONE (DELTASONE) 10 MG tablet Take 2 tablets (20 mg total) by mouth daily. 08/03/20   Avegno, Zachery Dakins, FNP  pseudoephedrine (SUDAFED) 30 MG tablet Take 30 mg by mouth every 4 (four) hours as needed for congestion.    [provider]  sertraline (ZOLOFT) 100 MG tablet Take 100 mg by mouth daily.    [provider]  triamcinolone cream (KENALOG) 0.1 % Apply 1 Application topically 2 (two) times daily. 03/01/22   Particia Nearing, PA-C    Family History Family History  Family history unknown: Yes    Social History Social History   Tobacco Use   Smoking status: Former    Types: Cigarettes   Smokeless tobacco: Never  Substance Use Topics   Alcohol use: Not Currently   Drug use: Never     Allergies   Patient has no known allergies.   Review of Systems Review of Systems Per HPI  Physical Exam Triage Vital Signs ED Triage Vitals  Encounter Vitals Group     BP 05/03/23 1110 122/75     Systolic BP Percentile --      Diastolic  BP Percentile --      Pulse Rate 05/03/23 1110 81     Resp 05/03/23 1110 20     Temp 05/03/23 1110 98.4 F (36.9 C)     Temp Source 05/03/23 1110 Oral     SpO2 05/03/23 1110 96 %     Weight --      Height --      Head Circumference --      Peak Flow --      Pain Score 05/03/23 1111 5     Pain Loc --      Pain Education --      Exclude from Growth Chart --    No data found.  Updated Vital Signs BP 122/75 (BP Location: Right Arm)   Pulse 81   Temp 98.4 F (36.9 C) (Oral)   Resp 20   SpO2 96%   Visual Acuity Right Eye Distance:   Left Eye Distance:   Bilateral Distance:    Right Eye Near:   Left Eye Near:    Bilateral  Near:     Physical Exam Vitals and nursing note reviewed.  Constitutional:      Appearance: Normal appearance. She is not ill-appearing.  HENT:     Head: Atraumatic.     Mouth/Throat:     Mouth: Mucous membranes are moist.  Eyes:     Extraocular Movements: Extraocular movements intact.     Conjunctiva/sclera: Conjunctivae normal.  Cardiovascular:     Rate and Rhythm: Normal rate and regular rhythm.     Heart sounds: Normal heart sounds.  Pulmonary:     Effort: Pulmonary effort is normal.     Breath sounds: Normal breath sounds.  Abdominal:     General: Bowel sounds are normal. There is no distension.     Palpations: Abdomen is soft.     Tenderness: There is no abdominal tenderness. There is no right CVA tenderness, left CVA tenderness or guarding.  Musculoskeletal:        General: Normal range of motion.     Cervical back: Normal range of motion and neck supple.  Skin:    General: Skin is warm and dry.  Neurological:     Mental Status: She is alert and oriented to person, place, and time.     Motor: No weakness.     Gait: Gait normal.  Psychiatric:        Mood and Affect: Mood normal.        Thought Content: Thought content normal.        Judgment: Judgment normal.      UC Treatments / Results  Labs (all labs ordered are listed, but only abnormal results are displayed) Labs Reviewed  POCT URINALYSIS DIP (MANUAL ENTRY) - Abnormal; Notable for the following components:      Result Value   Clarity, UA cloudy (*)    Spec Grav, UA >=1.030 (*)    Blood, UA large (*)    Protein Ur, POC =100 (*)    Nitrite, UA Positive (*)    Leukocytes, UA Trace (*)    All other components within normal limits  URINE CULTURE    EKG   Radiology No results found.  Procedures Procedures (including critical care time)  Medications Ordered in UC Medications - No data to display  Initial Impression / Assessment and Plan / UC Course  I have reviewed the triage vital signs and  the nursing notes.  Pertinent labs & imaging results that were available  during my care of the patient were reviewed by me and considered in my medical decision making (see chart for details).     Vitals and exam very reassuring today, urinalysis with evidence of urinary tract infection.  Urine culture pending, treat with Keflex, fluids and adjust if needed.  Return for worsening symptoms.  Final Clinical Impressions(s) / UC Diagnoses   Final diagnoses:  Acute lower UTI     Discharge Instructions      Someone will call if your urine culture tells [place] that we need to make any changes to your plan.  Drink plenty of fluids, follow-up for worsening symptoms    ED Prescriptions     Medication Sig Dispense Auth. Provider   cephALEXin (KEFLEX) 500 MG capsule Take 1 capsule (500 mg total) by mouth 2 (two) times daily. 10 capsule Particia Nearing, [place]      PDMP not reviewed this encounter.   Particia Nearing, [place] 05/03/23 1133

## 2023-05-03 NOTE — ED Triage Notes (Signed)
Pt reports she has blood in her urine and  when she wipes, urgency, and it is painful when she urinates x 2 days

## 2023-05-03 NOTE — Discharge Instructions (Signed)
Someone will call if your urine culture tells [place] that we need to make any changes to your plan.  Drink plenty of fluids, follow-up for worsening symptoms

## 2023-05-04 LAB — URINE CULTURE: Culture: <10000 — AB

## 2023-05-07 LAB — MISCELLANEOUS TEST

## 2023-12-20 ENCOUNTER — Ambulatory Visit: Admitting: Family Medicine

## 2024-01-31 ENCOUNTER — Ambulatory Visit: Admitting: Family Medicine

## 2024-02-21 ENCOUNTER — Ambulatory Visit: Admitting: Physician Assistant

## 2024-02-28 ENCOUNTER — Ambulatory Visit: Admitting: Family Medicine

## 2024-03-13 ENCOUNTER — Ambulatory Visit: Admitting: Family Medicine

## 2024-03-13 ENCOUNTER — Encounter: Payer: Self-pay | Admitting: Family Medicine

## 2024-03-13 ENCOUNTER — Ambulatory Visit
Admission: RE | Admit: 2024-03-13 | Discharge: 2024-03-13 | Disposition: A | Discharge status: Home / Self Care | Source: Ambulatory Visit | Attending: Family Medicine | Admitting: Family Medicine

## 2024-03-13 VITALS — BP 107/44 | HR 80 | Ht 64.0 in | Wt 208.0 lb

## 2024-03-13 DIAGNOSIS — G43109 Migraine with aura, not intractable, without status migrainosus: Secondary | ICD-10-CM | POA: Insufficient documentation

## 2024-03-13 DIAGNOSIS — E669 Obesity, unspecified: Secondary | ICD-10-CM | POA: Insufficient documentation

## 2024-03-13 DIAGNOSIS — Z13220 Encounter for screening for lipoid disorders: Secondary | ICD-10-CM | POA: Diagnosis not present

## 2024-03-13 DIAGNOSIS — I7771 Dissection of carotid artery: Secondary | ICD-10-CM | POA: Diagnosis not present

## 2024-03-13 DIAGNOSIS — N871 Moderate cervical dysplasia: Secondary | ICD-10-CM | POA: Insufficient documentation

## 2024-03-13 DIAGNOSIS — I773 Arterial fibromuscular dysplasia: Secondary | ICD-10-CM | POA: Insufficient documentation

## 2024-03-13 DIAGNOSIS — Z130 Encounter for screening for diseases of the blood and blood-forming organs and certain disorders involving the immune mechanism: Secondary | ICD-10-CM | POA: Diagnosis not present

## 2024-03-13 DIAGNOSIS — F419 Anxiety disorder, unspecified: Secondary | ICD-10-CM | POA: Insufficient documentation

## 2024-03-13 MED ORDER — IOPAMIDOL (ISOVUE-370) INJECTION 76%
80.0000 mL | Freq: Once | INTRAVENOUS | Status: AC | PRN
  Administered 2024-03-13: 80 mL via INTRAVENOUS

## 2024-03-13 MED ORDER — NURTEC 75 MG PO TBDP: 75.0000 mg | ORAL_TABLET | ORAL | 6 refills | Status: AC

## 2024-03-13 NOTE — Patient Instructions (Addendum)
 Labs today.  Medication sent in. Stop birth control.  Arranging CT scan.  Arranging neurology referral.  Take care  Dr. Bluford

## 2024-03-13 NOTE — Assessment & Plan Note (Signed)
 Prior dissection.  Reassessing today with CTA.

## 2024-03-13 NOTE — Assessment & Plan Note (Signed)
 Likely migraine.  Could be related to fibromuscular dysplasia.  Trial of Nurtec.  Reaching out to neurology.  Placing referral.

## 2024-03-13 NOTE — Assessment & Plan Note (Signed)
 Reassessing today with CTA

## 2024-03-13 NOTE — Progress Notes (Signed)
 Subjective:  Patient ID: Sharon Gillespie, female    DOB: Feb 04, 1971  Age: 53 y.o. MRN: [id number]  CC:   Chief Complaint  Patient presents with   Establish Care   Headache    Off and on headaches , interfering with her job    HPI:  53 year old female presents for evaluation of the above.  Patient reports ongoing headaches.  She states that prior to the headache occurring she has bursts of light like a fireworks.  She states that she also has floaters.  Then the headache starts which is typically left-sided in the frontal/temporal region or in the occipital region.  Associated photophobia.  No nausea.  She states that these are occurring quite frequently.  She states that they are interfering with her ability to work.  Patient is very troubled by her symptoms.  She is very concerned.  In 2022, patient had sudden onset headache and ptosis.  She was evaluated by ophthalmology and was subsequently sent to the hospital.  CTA revealed focal dissection of the internal carotid artery.  It also revealed findings consistent with fibromuscular dysplasia.  It was advised that she follow-up with neurology.  This was never done as she lost insurance coverage.  Social Hx   Social History   Socioeconomic History   Marital status: Married    Spouse name: Not on file   Number of children: Not on file   Years of education: Not on file   Highest education level: Not on file  Occupational History   Not on file  Tobacco Use   Smoking status: Former    Types: Cigarettes   Smokeless tobacco: Never  Substance and Sexual Activity   Alcohol use: Not Currently   Drug use: Never   Sexual activity: Not on file  Other Topics Concern   Not on file  Social History Narrative   ** Merged History Encounter **       Social Drivers of Corporate investment banker Strain: Not on file  Food Insecurity: Not on file  Transportation Needs: Not on file  Physical Activity: Not on file  Stress: Not on file   Social Connections: Not on file    Review of Systems Per HPI  Objective:  BP (!) 107/44   Pulse 80   Ht 5' 4 (1.626 m)   Wt 208 lb (94.3 kg)   SpO2 99%   BMI 35.70 kg/m      03/13/2024    8:32 AM 05/03/2023   11:10 AM 03/28/2023    9:35 AM  BP/Weight  Systolic BP 107 122 120  Diastolic BP 44 75 85  Wt. (Lbs) 208    BMI 35.7 kg/m2      Physical Exam Vitals and nursing note reviewed.  Constitutional:      General: She is not in acute distress.    Appearance: Normal appearance.  HENT:     Head: Normocephalic and atraumatic.     Nose: Nose normal.  Eyes:     Conjunctiva/sclera: Conjunctivae normal.     Pupils: Pupils are equal, round, and reactive to light.  Cardiovascular:     Rate and Rhythm: Normal rate and regular rhythm.  Pulmonary:     Effort: Pulmonary effort is normal.     Breath sounds: Normal breath sounds.  Musculoskeletal:     Cervical back: Neck supple.  Neurological:     General: No focal deficit present.     Mental Status: She is alert.  Psychiatric:  Comments: Tearful.     Lab Results  Component Value Date   WBC 6.5 03/26/2023   HGB 12.4 03/26/2023   HCT 37.1 03/26/2023   PLT 272 03/26/2023   GLUCOSE 83 03/26/2023   ALT 17 03/26/2023   AST 22 03/26/2023   NA 136 03/26/2023   K 3.1 (L) 03/26/2023   CL 103 03/26/2023   CREATININE 0.96 03/26/2023   BUN 9 03/26/2023   CO2 22 03/26/2023     Assessment & Plan:  Migraine with aura and without status migrainosus, not intractable Assessment & Plan: Likely migraine.  Could be related to fibromuscular dysplasia.  Trial of Nurtec.  Reaching out to neurology.  Placing referral.  Orders: -     CMP14+EGFR -     TSH -     Ambulatory referral to Neurology  Screening for deficiency anemia -     CBC  Screening, lipid -     Lipid panel  Carotid artery dissection Assessment & Plan: Prior dissection.  Reassessing today with CTA.  Orders: -     CT ANGIO NECK W OR WO CONTRAST -      Ambulatory referral to Neurology  Fibromuscular dysplasia of carotid artery Assessment & Plan: Reassessing today with CTA   Other orders -     Nurtec; Take 1 tablet (75 mg total) by mouth every other day.  Dispense: 16 tablet; Refill: 6    Follow-up: Pending results of CT  Desa Rech DO [facility]

## 2024-03-14 LAB — CBC
Hematocrit: 38.1 % (ref 34.0–46.6)
Hemoglobin: 12.6 g/dL (ref 11.1–15.9)
MCH: 29.6 pg (ref 26.6–33.0)
MCHC: 33.1 g/dL (ref 31.5–35.7)
MCV: 90 fL (ref 79–97)
Platelets: 305 x10E3/uL (ref 150–450)
RBC: 4.25 x10E6/uL (ref 3.77–5.28)
RDW: 12.8 % (ref 11.7–15.4)
WBC: 5.3 x10E3/uL (ref 3.4–10.8)

## 2024-03-14 LAB — CMP14+EGFR
ALT: 17 IU/L (ref 0–32)
AST: 18 IU/L (ref 0–40)
Albumin: 4.3 g/dL (ref 3.8–4.9)
Alkaline Phosphatase: 51 IU/L (ref 49–135)
BUN/Creatinine Ratio: 13 (ref 9–23)
BUN: 10 mg/dL (ref 6–24)
Bilirubin Total: 0.3 mg/dL (ref 0.0–1.2)
CO2: 23 mmol/L (ref 20–29)
Calcium: 9.4 mg/dL (ref 8.7–10.2)
Chloride: 103 mmol/L (ref 96–106)
Creatinine, Ser: 0.76 mg/dL (ref 0.57–1.00)
Globulin, Total: 2.2 g/dL (ref 1.5–4.5)
Glucose: 91 mg/dL (ref 70–99)
Potassium: 4.9 mmol/L (ref 3.5–5.2)
Sodium: 140 mmol/L (ref 134–144)
Total Protein: 6.5 g/dL (ref 6.0–8.5)
eGFR: 94 mL/min/1.73 (ref >59)

## 2024-03-14 LAB — LIPID PANEL
Chol/HDL Ratio: 2.5 ratio (ref 0.0–4.4)
Cholesterol, Total: 163 mg/dL (ref 100–199)
HDL: 65 mg/dL (ref >39)
LDL Chol Calc (NIH): 85 mg/dL (ref 0–99)
Triglycerides: 68 mg/dL (ref 0–149)
VLDL Cholesterol Cal: 13 mg/dL (ref 5–40)

## 2024-03-14 LAB — TSH: TSH: 2.38 u[IU]/mL (ref 0.450–4.500)

## 2024-03-15 ENCOUNTER — Ambulatory Visit: Payer: Self-pay | Admitting: Family Medicine

## 2024-03-23 ENCOUNTER — Encounter: Payer: Self-pay | Admitting: Neurology

## 2024-03-31 ENCOUNTER — Ambulatory Visit: Admitting: Neurology

## 2024-04-02 ENCOUNTER — Ambulatory Visit
Admission: RE | Admit: 2024-04-02 | Discharge: 2024-04-02 | Disposition: A | Discharge status: Home / Self Care | Source: Ambulatory Visit | Attending: Nurse Practitioner | Admitting: Nurse Practitioner

## 2024-04-02 VITALS — BP 141/78 | HR 90 | Temp 98.2°F | Resp 18

## 2024-04-02 DIAGNOSIS — U071 COVID-19: Secondary | ICD-10-CM

## 2024-04-02 DIAGNOSIS — J029 Acute pharyngitis, unspecified: Secondary | ICD-10-CM | POA: Diagnosis not present

## 2024-04-02 LAB — POC COVID19/FLU A&B COMBO
Covid Antigen, POC: POSITIVE — AB
Influenza A Antigen, POC: NEGATIVE
Influenza B Antigen, POC: NEGATIVE

## 2024-04-02 MED ORDER — PAXLOVID (300/100) 20 X 150 MG & 10 X 100MG PO TBPK: 3.0000 | ORAL_TABLET | Freq: Two times a day (BID) | ORAL | 0 refills | Status: AC

## 2024-04-02 MED ORDER — PROMETHAZINE-DM 6.25-15 MG/5ML PO SYRP: 5.0000 mL | ORAL_SOLUTION | Freq: Four times a day (QID) | ORAL | 0 refills | Status: AC | PRN

## 2024-04-02 MED ORDER — FLUTICASONE PROPIONATE 50 MCG/ACT NA SUSP: 2.0000 | Freq: Every day | NASAL | 0 refills | Status: AC

## 2024-04-02 NOTE — ED Provider Notes (Signed)
 [facility]    CSN: [id number] Arrival date & time: 04/02/24  1752      History   Chief Complaint Chief Complaint  Patient presents with   Fever    Maybe just a cold but today makes day three of missed work. - Entered by patient    HPI Sharon Gillespie is a 53 y.o. female.   The history is provided by the patient.   Patient presents with a 3-day history of fatigue, fever, body aches, nasal congestion, sore throat, cough, and headache.  Patient denies ear pain, ear drainage, wheezing, difficulty breathing, abdominal pain, nausea, vomiting, diarrhea, or rash.  Patient states that she went out of town with her boyfriend prior to her symptoms starting.  She states that he is exhibiting the same or similar symptoms.  States so far, she has been taking over-the-counter medications for her symptoms with minimal relief. Past Medical History:  Diagnosis Date   Anxiety     Patient Active Problem List   Diagnosis Date Noted   Anxiety 03/13/2024   Obesity (BMI 30-39.9) 03/13/2024   Carotid artery dissection 03/13/2024   Migraine with aura and without status migrainosus, not intractable 03/13/2024   Fibromuscular dysplasia of carotid artery 03/13/2024    History reviewed. No pertinent surgical history.  OB History   No obstetric history on file.      Home Medications    Prior to Admission medications   Medication Sig Start Date End Date Taking? Authorizing Provider  fluticasone  (FLONASE ) 50 MCG/ACT nasal spray Place 2 sprays into both nostrils daily. 04/02/24  Yes Leath-Warren, Etta PARAS, NP  nirmatrelvir/ritonavir (PAXLOVID, 300/100,) 20 x 150 MG & 10 x 100MG  TBPK Take 3 tablets by mouth 2 (two) times daily for 5 days. Patient GFR is 94.Take nirmatrelvir (150 mg) two tablets twice daily for 5 days and ritonavir (100 mg) one tablet twice daily for 5 days. 04/02/24 04/07/24 Yes Leath-Warren, Etta PARAS, NP  promethazine-dextromethorphan (PROMETHAZINE-DM) 6.25-15  MG/5ML syrup Take 5 mLs by mouth 4 (four) times daily as needed. 04/02/24  Yes Leath-Warren, Etta PARAS, NP  norethindrone-ethinyl estradiol 1/35 (ALAYCEN 1/35) tablet Take 1 tablet by mouth daily.    [provider]  Rimegepant Sulfate (NURTEC) 75 MG TBDP Take 1 tablet (75 mg total) by mouth every other day. 03/13/24   Cook, Jayce G, DO  sertraline (ZOLOFT) 100 MG tablet Take 100 mg by mouth daily.    [provider]    Family History Family History  Family history unknown: Yes    Social History Social History   Tobacco Use   Smoking status: Former    Types: Cigarettes   Smokeless tobacco: Never  Vaping Use   Vaping status: Never Used  Substance Use Topics   Alcohol use: Not Currently   Drug use: Never     Allergies   Patient has no known allergies.   Review of Systems Review of Systems Per HPI  Physical Exam Triage Vital Signs ED Triage Vitals  Encounter Vitals Group     BP 04/02/24 1800 (!) 141/78     Girls Systolic BP Percentile --      Girls Diastolic BP Percentile --      Boys Systolic BP Percentile --      Boys Diastolic BP Percentile --      Pulse Rate 04/02/24 1800 90     Resp 04/02/24 1800 18     Temp 04/02/24 1800 98.2 F (36.8 C)  Temp Source 04/02/24 1800 Oral     SpO2 04/02/24 1800 94 %     Weight --      Height --      Head Circumference --      Peak Flow --      Pain Score 04/02/24 1801 0     Pain Loc --      Pain Education --      Exclude from Growth Chart --    No data found.  Updated Vital Signs BP (!) 141/78 (BP Location: Right Arm)   Pulse 90   Temp 98.2 F (36.8 C) (Oral)   Resp 18   SpO2 94%   Visual Acuity Right Eye Distance:   Left Eye Distance:   Bilateral Distance:    Right Eye Near:   Left Eye Near:    Bilateral Near:     Physical Exam Vitals and nursing note reviewed.  Constitutional:      General: She is not in acute distress.    Appearance: Normal appearance. She is well-developed.   HENT:     Head: Normocephalic and atraumatic.     Right Ear: Tympanic membrane, ear canal and external ear normal.     Left Ear: Tympanic membrane, ear canal and external ear normal.     Nose: Congestion present.     Right Turbinates: Enlarged and swollen.     Left Turbinates: Enlarged and swollen.     Right Sinus: No maxillary sinus tenderness or frontal sinus tenderness.     Left Sinus: No maxillary sinus tenderness or frontal sinus tenderness.     Mouth/Throat:     Lips: Pink.     Mouth: Mucous membranes are moist.     Pharynx: Uvula midline. Posterior oropharyngeal erythema and postnasal drip present. No pharyngeal swelling, oropharyngeal exudate or uvula swelling.     Comments: Cobblestoning present to posterior oropharynx  Eyes:     Extraocular Movements: Extraocular movements intact.     Conjunctiva/sclera: Conjunctivae normal.     Pupils: Pupils are equal, round, and reactive to light.  Neck:     Thyroid: No thyromegaly.     Trachea: No tracheal deviation.  Cardiovascular:     Rate and Rhythm: Regular rhythm.     Pulses: Normal pulses.     Heart sounds: Normal heart sounds.  Pulmonary:     Effort: Pulmonary effort is normal. No respiratory distress.     Breath sounds: Normal breath sounds. No stridor. No wheezing, rhonchi or rales.  Abdominal:     General: Bowel sounds are normal.     Palpations: Abdomen is soft.     Tenderness: There is no abdominal tenderness.  Musculoskeletal:     Cervical back: Normal range of motion and neck supple.  Skin:    General: Skin is warm and dry.  Neurological:     General: No focal deficit present.     Mental Status: She is alert and oriented to person, place, and time.  Psychiatric:        Mood and Affect: Mood normal.        Behavior: Behavior normal.        Thought Content: Thought content normal.        Judgment: Judgment normal.      UC Treatments / Results  Labs (all labs ordered are listed, but only abnormal results  are displayed) Labs Reviewed  POC COVID19/FLU A&B COMBO - Abnormal; Notable for the following components:  Result Value   Covid Antigen, POC Positive (*)    All other components within normal limits    EKG   Radiology No results found.  Procedures Procedures (including critical care time)  Medications Ordered in UC Medications - No data to display  Initial Impression / Assessment and Plan / UC Course  I have reviewed the triage vital signs and the nursing notes.  Pertinent labs & imaging results that were available during my care of the patient were reviewed by me and considered in my medical decision making (see chart for details).  COVID/flu test was positive for COVID.  Patient has elected to begin Paxlovid.  Symptomatic treatment provided with prescriptions for Promethazine DM for the cough, and fluticasone  50 micro nasal spray for nasal congestion and runny nose.  Supportive care recommendations were provided discussed with the patient to include fluids, rest, over-the-counter analgesics, normal saline nasal spray, and use of a humidifier at nighttime during sleep.  Patient was given strict ER follow-up precautions.  Also discussed indications for patient to follow-up with her PCP.  Patient was in agreement with this plan of care and verbalizes understanding.  All questions were answered.  Patient stable for discharge.  Work note was provided.  Final Clinical Impressions(s) / UC Diagnoses   Final diagnoses:  Sore throat  COVID     Discharge Instructions      The COVID/flu test was positive for COVID. Take medication as prescribed. Increase fluids and allow for plenty of rest. You may continue over-the-counter Tylenol or ibuprofen as needed for pain, fever, or general discomfort. Recommend the use of normal saline nasal spray throughout the day for nasal congestion or runny nose. For your cough, recommend the use of a humidifier in your bedroom at nighttime during  sleep and sleeping elevated on pillows while cough symptoms persist. You should remain home until you have been fever free for 24 hours with no medication. Go to the emergency department if you experience shortness of breath, difficulty breathing, or worsening symptoms. Follow-up with your primary care physician if your symptoms fail to improve. Follow-up as needed.     ED Prescriptions     Medication Sig Dispense Auth. Provider   nirmatrelvir/ritonavir (PAXLOVID, 300/100,) 20 x 150 MG & 10 x 100MG  TBPK Take 3 tablets by mouth 2 (two) times daily for 5 days. Patient GFR is 94.Take nirmatrelvir (150 mg) two tablets twice daily for 5 days and ritonavir (100 mg) one tablet twice daily for 5 days. 30 tablet Leath-Warren, Etta PARAS, NP   promethazine-dextromethorphan (PROMETHAZINE-DM) 6.25-15 MG/5ML syrup Take 5 mLs by mouth 4 (four) times daily as needed. 118 mL Leath-Warren, Etta PARAS, NP   fluticasone  (FLONASE ) 50 MCG/ACT nasal spray Place 2 sprays into both nostrils daily. 16 g Leath-Warren, Etta PARAS, NP      PDMP not reviewed this encounter.   Gilmer Etta PARAS, NP 04/02/24 [phone]

## 2024-04-02 NOTE — Discharge Instructions (Addendum)
 The COVID/flu test was positive for COVID. Take medication as prescribed. Increase fluids and allow for plenty of rest. You may continue over-the-counter Tylenol or ibuprofen as needed for pain, fever, or general discomfort. Recommend the use of normal saline nasal spray throughout the day for nasal congestion or runny nose. For your cough, recommend the use of a humidifier in your bedroom at nighttime during sleep and sleeping elevated on pillows while cough symptoms persist. You should remain home until you have been fever free for 24 hours with no medication. Go to the emergency department if you experience shortness of breath, difficulty breathing, or worsening symptoms. Follow-up with your primary care physician if your symptoms fail to improve. Follow-up as needed.

## 2024-04-02 NOTE — ED Triage Notes (Signed)
 Fever since Tuesday, sore throat that comes and goes, nasal congestion, body aches, headache, fatigue.

## 2024-04-15 ENCOUNTER — Encounter: Payer: Self-pay | Admitting: Neurology

## 2024-04-15 ENCOUNTER — Ambulatory Visit: Admitting: Neurology

## 2024-04-15 VITALS — BP 117/78 | HR 79 | Ht 64.0 in | Wt 206.0 lb

## 2024-04-15 DIAGNOSIS — I773 Arterial fibromuscular dysplasia: Secondary | ICD-10-CM

## 2024-04-15 DIAGNOSIS — G43109 Migraine with aura, not intractable, without status migrainosus: Secondary | ICD-10-CM | POA: Diagnosis not present

## 2024-04-15 MED ORDER — CYCLOBENZAPRINE HCL 5 MG PO TABS: 5.0000 mg | ORAL_TABLET | Freq: Every evening | ORAL | 1 refills | Status: AC | PRN

## 2024-04-15 NOTE — Progress Notes (Signed)
 [facility] Clinic Note - Initial Visit   Date: 04/15/2024   Sharon Gillespie MRN: [id number] DOB: 1970/12/24   Dear Dr. Bluford:  Thank you for your kind referral of Sharon Gillespie for consultation of headaches and FMD. Although her history is well known to you, please allow us  to reiterate it for the purpose of our medical record. The patient was accompanied to the clinic by self.  [name] is a 53 y.o. right-handed female with anixety presenting for evaluation of headaches and fibromuscular dysplasia.   IMPRESSION/PLAN: Episodic migraine headaches with aura.  Triptans and ergots are contraindicated in fibromuscular dysplasia due to vasoconstrictive effects.  - for severe migraine, she may take Aleve + benadryl 25mg  OR flexeril 5mg   - She may continue to take ibuprofen and tylenol, if she has better relief, but no more than twice per week - CGRP antagonist medications are cost prohibitive (Nurtec $2000), but if she has no benefit with the above, may need to explore other medications such as Qulipta  2.  Fibromuscular dysplasia, stable on imaging.  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  She does not have any ongoing neurological symptoms or deficits.   - Continue daily aspirin  81mg   - Repeat CTA neck in 1 year   Return to clinic in 6 months  ------------------------------------------------------------- History of present illness: She has a long history of headaches since high school.  Headaches are holocephalic and throbbing.  She typically has headaches 3 days prior to starting menstrual cycle.  Other triggers include:  Triggers are stress, emotional changes, hot temperature.  Three years ago, she began having visual aura with her headaches, describes as white fireworks.  Aura last 10-45 minutes but obstructs her vision, so when this occurs she is unable to continue working.  She works as a probation officer.  When severe, she takes ibupofen 600mg  + tylenol  1500mg  daily which provides relief.  She is taking this at least one per month.  She was telling her PCP about migraine with aura and recommended stopping her contraceptive medication.  Since doing this, her headaches have reduced in intensity and severity.  She has not tried any preventative therapy.  Headaches are several times per month.  Her brother also has history of migraines.   In 2022, she developed acute onset of left ptosis and vision changes and severe headache.  She was evaluated in the ER where CTA showed left ICA dissection.  MRI brain did not show evidence of stroke.  She was started on aspirin  and within a month, her ptosis and pain had resolved.  She stopped taking aspirin  for sometime and only recently restarted this.    Nonsmoker.    Out-side paper records, electronic medical record, and images have been reviewed where available and summarized as:  CTA neck 03/13/2024: 1. Fibromuscular dysplasia of the ICAs, more pronounced on the Right. No bona fide carotid dissection; mild irregularity of the LICA at the skull base is stable since 2022. 2. No atherosclerosis.  No arterial stenosis in the neck or at the skull base.  MRI brain wwo contrast 05/19/2021: 1. Redemonstrated dissection in the distal left ICA, better visualized on the same-day CTA. 2. No acute intracranial process. No abnormal parenchymal enhancement.  CTA head and neck 05/19/2021: 1. 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 2. 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 The  internal carotid artery distal to the dissection is patent. No evidence of dissection on the right. 3. Normal intracranial vasculature.   Lab Results  Component Value Date   TSH 2.380 03/13/2024   No results found for: ESRSEDRATE, POCTSEDRATE  Past Medical History:   Diagnosis Date   Anxiety     History reviewed. No pertinent surgical history.   Medications:  Outpatient Encounter Medications as of 04/15/2024  Medication Sig   aspirin  EC 81 MG tablet Take 81 mg by mouth daily. Swallow whole.   cyclobenzaprine (FLEXERIL) 5 MG tablet Take 1 tablet (5 mg total) by mouth at bedtime as needed (severe headache).   sertraline (ZOLOFT) 100 MG tablet Take 100 mg by mouth daily.   fluticasone  (FLONASE ) 50 MCG/ACT nasal spray Place 2 sprays into both nostrils daily. (Patient not taking: Reported on 04/15/2024)   norethindrone-ethinyl estradiol 1/35 (ALAYCEN 1/35) tablet Take 1 tablet by mouth daily. (Patient not taking: Reported on 04/15/2024)   promethazine-dextromethorphan (PROMETHAZINE-DM) 6.25-15 MG/5ML syrup Take 5 mLs by mouth 4 (four) times daily as needed. (Patient not taking: Reported on 04/15/2024)   Rimegepant Sulfate (NURTEC) 75 MG TBDP Take 1 tablet (75 mg total) by mouth every other day. (Patient not taking: Reported on 04/15/2024)   No facility-administered encounter medications on file as of 04/15/2024.    Allergies: No Known Allergies  Family History: Family History  Problem Relation Age of Onset   Healthy Mother    Healthy Father     Social History: Social History   Tobacco Use   Smoking status: Former    Types: Cigarettes   Smokeless tobacco: Never  Vaping Use   Vaping status: Some Days   Devices: Vapes sometimes  Substance Use Topics   Alcohol use: Not Currently   Drug use: Never   Social History   Social History Narrative   ** Merged History Encounter **      Are you right handed or left handed? Right Handed   Are you currently employed ? Yes    What is your current occupation? [employer]    Do you live at home alone? Yes   Who lives with you? Daughter    What type of home do you live in: 1 story or 2 story? Lives in a one story floor plan          Vital Signs:  BP 117/78   Pulse 79   Ht 5' 4 (1.626 m)    Wt 206 lb (93.4 kg)   SpO2 98%   BMI 35.36 kg/m    Neurological Exam: MENTAL STATUS including orientation to time, place, person, recent and remote memory, attention span and concentration, language, and fund of knowledge is normal.  Speech is not dysarthric.  CRANIAL NERVES: II:  No visual field defects.     III-IV-VI: Pupils equal round and reactive to light.  Normal conjugate, extra-ocular eye movements in all directions of gaze.  No nystagmus.  No ptosis.   V:  Normal facial sensation.    VII:  Normal facial symmetry and movements.   VIII:  Normal hearing and vestibular function.   IX-X:  Normal palatal movement.   XI:  Normal shoulder shrug and head rotation.   XII:  Normal tongue strength and range of motion, no deviation or fasciculation.  MOTOR:  Motor strength is 5/5 throughout.  No atrophy, fasciculations or abnormal movements.  No pronator drift.   MSRs:  Right        Left brachioradialis 2+  2+  biceps 2+  2+  triceps 2+  2+  patellar 2+  2+  ankle jerk 2+  2+  Hoffman no  no  plantar response down  down   SENSORY:  Normal and symmetric perception of vibration throughout.   COORDINATION/GAIT: Normal finger-to- nose-finger.  Intact rapid alternating movements bilaterally.  Able to rise from a chair without using arms.  Gait narrow based and stable.     Thank you for allowing me to participate in patient's care.  If I can answer any additional questions, I would be pleased to do so.    Sincerely,    Gracia Saggese K. Tobie, DO

## 2024-04-15 NOTE — Patient Instructions (Addendum)
 For severe headache, you can take Aleve and benadryl 25mg  together  Alternatively, you can try flexeril 5mg  as needed  Limit all ibuprofen, tylenol, and Aleve to twice per week  Continue aspirin  81mg  daily

## 2024-07-16 ENCOUNTER — Ambulatory Visit: Admitting: Family Medicine

## 2024-10-19 ENCOUNTER — Ambulatory Visit: Admitting: Neurology
